# Patient Record
Sex: Male | Born: 1975 | Race: White | Hispanic: No | Smoking: Former smoker
Health system: Southern US, Community
[De-identification: ages and names within clinical notes are randomized; demographics above are authoritative.]

## PROBLEM LIST (undated history)

## (undated) DIAGNOSIS — I10 Essential (primary) hypertension: Secondary | ICD-10-CM

---

## 2018-11-15 ENCOUNTER — Other Ambulatory Visit: Payer: Self-pay

## 2018-11-15 ENCOUNTER — Emergency Department (HOSPITAL_COMMUNITY)
Admission: EM | Admit: 2018-11-15 | Discharge: 2018-11-15 | Disposition: A | Discharge status: Home / Self Care | Payer: Self-pay | Attending: Emergency Medicine | Admitting: Emergency Medicine

## 2018-11-15 ENCOUNTER — Encounter (HOSPITAL_COMMUNITY): Payer: Self-pay

## 2018-11-15 DIAGNOSIS — Z87891 Personal history of nicotine dependence: Secondary | ICD-10-CM | POA: Insufficient documentation

## 2018-11-15 DIAGNOSIS — I10 Essential (primary) hypertension: Secondary | ICD-10-CM | POA: Insufficient documentation

## 2018-11-15 HISTORY — DX: Essential (primary) hypertension: I10

## 2018-11-15 LAB — CBC
HCT: 45.2 % (ref 39.0–52.0)
Hemoglobin: 14.6 g/dL (ref 13.0–17.0)
MCH: 28.2 pg (ref 26.0–34.0)
MCHC: 32.3 g/dL (ref 30.0–36.0)
MCV: 87.4 fL (ref 80.0–100.0)
NRBC: 0 % (ref 0.0–0.2)
Platelets: 203 10*3/uL (ref 150–400)
RBC: 5.17 MIL/uL (ref 4.22–5.81)
RDW: 13.4 % (ref 11.5–15.5)
WBC: 8.5 10*3/uL (ref 4.0–10.5)

## 2018-11-15 LAB — BASIC METABOLIC PANEL
ANION GAP: 6 (ref 5–15)
BUN: 15 mg/dL (ref 6–20)
CALCIUM: 8.9 mg/dL (ref 8.9–10.3)
CO2: 26 mmol/L (ref 22–32)
Chloride: 104 mmol/L (ref 98–111)
Creatinine, Ser: 0.8 mg/dL (ref 0.61–1.24)
GFR calc Af Amer: >60 mL/min (ref >60)
GFR calc non Af Amer: >60 mL/min (ref >60)
GLUCOSE: 108 mg/dL — AB (ref 70–99)
POTASSIUM: 4 mmol/L (ref 3.5–5.1)
Sodium: 136 mmol/L (ref 135–145)

## 2018-11-15 MED ORDER — LISINOPRIL 10 MG PO TABS
10.0000 mg | ORAL_TABLET | Freq: Every day | ORAL | Status: DC
  Administered 2018-11-15: 10 mg via ORAL
  Filled 2018-11-15: qty 1

## 2018-11-15 MED ORDER — LISINOPRIL 10 MG PO TABS: 10.0000 mg | ORAL_TABLET | Freq: Every day | ORAL | 3 refills | Status: DC

## 2018-11-15 NOTE — ED Notes (Signed)
EDP at bedside  

## 2018-11-15 NOTE — ED Triage Notes (Signed)
Pt's wife reports pt was having physical for a new job today and was told he had uncontrolled htn.  Reports bp was 148/102.  Reports pt used to be on lisinopril but hasn't taken it in 3 years.  Pt doesn't have a pcp at this time and needs bp controlled before he can get hired.

## 2018-11-15 NOTE — Discharge Instructions (Addendum)
Complete blood count and kidney function normal today.  Initiate the lisinopril 10 mg each day for treatment of your high blood pressure.  First dose given here today.  Keep a daily log of your blood pressures.  Follow-up in 1 week to have pressures reevaluated either at the free clinic in [place] or by the urgent care [place] in [place].  Return for any stroke symptoms severe headache severe chest pain or trouble breathing.  As we discussed, for your job this will be the initiation of your blood pressure therapy;  it will probably require adjustments to fine-tune it.  You are stable and safe to start work.

## 2018-11-15 NOTE — ED Provider Notes (Signed)
[facility] EMERGENCY DEPARTMENT Provider Note   CSN: [id number] Arrival date & time: 11/15/18  1527     History   Chief Complaint Chief Complaint  Patient presents with  . Hypertension    HPI Andrew Fields is a 42 y.o. male.  Patient being evaluated for new job.  Had his physical.  Was noted to have elevated blood pressure with systolics in the 150s.  Patient used to be on lisinopril 10 mg daily been off for about 3 years.  Patient without any specific symptoms.  No severe headache no severe chest pain no trouble breathing no strokelike symptoms.  Job referred him in for evaluation of the high blood pressure and to initiate therapy.  Patient currently does not have a primary care provider.     Past Medical History:  Diagnosis Date  . Hypertension     There are no active problems to display for this patient.   History reviewed. No pertinent surgical history.      Home Medications    Prior to Admission medications   Not on File    Family History No family history on file.  Social History Social History   Tobacco Use  . Smoking status: Former Games developer  . Smokeless tobacco: Never Used  Substance Use Topics  . Alcohol use: Yes    Comment: occ  . Drug use: Never     Allergies   Patient has no known allergies.   Review of Systems Review of Systems  Constitutional: Negative for fever.  HENT: Negative for congestion.   Eyes: Negative for visual disturbance.  Respiratory: Negative for shortness of breath.   Cardiovascular: Negative for chest pain, palpitations and leg swelling.  Gastrointestinal: Negative for abdominal pain, nausea and vomiting.  Genitourinary: Negative for dysuria and hematuria.  Musculoskeletal: Negative for neck pain.  Skin: Negative for rash.  Neurological: Negative for seizures, syncope and headaches.  Hematological: Does not bruise/bleed easily.  Psychiatric/Behavioral: Negative for confusion.     Physical  Exam Updated Vital Signs BP (!) 139/99   Pulse 83   Temp 97.6 F (36.4 C) (Oral)   Resp 14   Ht 1.956 m (6\' 5" )   Wt 129.3 kg   SpO2 100%   BMI 33.80 kg/m   Physical Exam  Constitutional: He is oriented to person, place, and time. He appears well-developed and well-nourished. No distress.  HENT:  Head: Normocephalic and atraumatic.  Mouth/Throat: Oropharynx is clear and moist.  Eyes: Pupils are equal, round, and reactive to light. Conjunctivae and EOM are normal.  Neck: Neck supple.  Cardiovascular: Normal rate, regular rhythm and normal heart sounds.  Pulmonary/Chest: Effort normal and breath sounds normal. No respiratory distress.  Abdominal: Soft. Bowel sounds are normal. There is no tenderness.  Musculoskeletal: Normal range of motion. He exhibits no edema.  Neurological: He is alert and oriented to person, place, and time. No cranial nerve deficit or sensory deficit. He exhibits normal muscle tone. Coordination normal.  Skin: Skin is warm.  Nursing note and vitals reviewed.    ED Treatments / Results  Labs (all labs ordered are listed, but only abnormal results are displayed) Labs Reviewed  BASIC METABOLIC PANEL - Abnormal; Notable for the following components:      Result Value   Glucose, Bld 108 (*)    All other components within normal limits  CBC    EKG EKG Interpretation  Date/Time:  Tuesday November 15 2018 15:44:29 EST Ventricular Rate:  86 PR Interval:  QRS Duration: 98 QT Interval:  376 QTC Calculation: 450 R Axis:   72 Text Interpretation:  Sinus rhythm Probable inferior infarct, age indeterminate Baseline wander in lead(s) III No previous ECGs available Confirmed by Vanetta MuldersZackowski, Carylon Tamburro [phone]([id number] on 11/15/2018 4:13:58 PM   Radiology No results found.  Procedures Procedures (including critical care time)  Medications Ordered in ED Medications  lisinopril (PRINIVIL,ZESTRIL) tablet 10 mg (has no administration in time range)     Initial  Impression / Assessment and Plan / ED Course  I have reviewed the triage vital signs and the nursing notes.  Pertinent labs & imaging results that were available during my care of the patient were reviewed by me and considered in my medical decision making (see chart for details).   Patient with a long-standing history of hypertension.  Has been off medication which she is to be lisinopril probably for about 3 years.  During a job physical today blood pressure was noted to have systolic pressures around 150.  Most recently just in the emergency department here at time of discharge without any treatment patient's blood pressure was 138/93.  However the general trend here today is been elevated.  Since he was on blood pressure medicine in the past most likely needs a restart.  Patient's CBC and basic metabolic panel here without any abnormalities.  EKG without any acute findings.  Patient will be restarted on lisinopril.  He has ability to check his blood pressure for a log at home.  He will follow-up either with urgent care or the free clinic here in [place].  Patient is stable for return back to work.  Patient without any significant endorgan abnormalities no severe headache no severe chest pain no shortness of breath no strokelike symptoms.    Final Clinical Impressions(s) / ED Diagnoses   Final diagnoses:  Essential hypertension    ED Discharge Orders    None       Vanetta MuldersZackowski, Camreigh Michie, MD 11/15/18 1722

## 2018-12-05 ENCOUNTER — Emergency Department (HOSPITAL_COMMUNITY)
Admission: EM | Admit: 2018-12-05 | Discharge: 2018-12-05 | Disposition: A | Discharge status: Home / Self Care | Payer: Self-pay | Attending: Emergency Medicine | Admitting: Emergency Medicine

## 2018-12-05 ENCOUNTER — Other Ambulatory Visit: Payer: Self-pay

## 2018-12-05 ENCOUNTER — Encounter (HOSPITAL_COMMUNITY): Payer: Self-pay | Admitting: Emergency Medicine

## 2018-12-05 DIAGNOSIS — L02214 Cutaneous abscess of groin: Secondary | ICD-10-CM

## 2018-12-05 DIAGNOSIS — Z23 Encounter for immunization: Secondary | ICD-10-CM | POA: Insufficient documentation

## 2018-12-05 DIAGNOSIS — I10 Essential (primary) hypertension: Secondary | ICD-10-CM | POA: Insufficient documentation

## 2018-12-05 DIAGNOSIS — Z87891 Personal history of nicotine dependence: Secondary | ICD-10-CM | POA: Insufficient documentation

## 2018-12-05 MED ORDER — DOXYCYCLINE HYCLATE 100 MG PO CAPS: 100.0000 mg | ORAL_CAPSULE | Freq: Two times a day (BID) | ORAL | 0 refills | Status: AC

## 2018-12-05 MED ORDER — LIDOCAINE HCL (PF) 1 % IJ SOLN
INTRAMUSCULAR | Status: AC
  Administered 2018-12-05: 10 mL
  Filled 2018-12-05: qty 20

## 2018-12-05 MED ORDER — TETANUS-DIPHTH-ACELL PERTUSSIS 5-2.5-18.5 LF-MCG/0.5 IM SUSP
0.5000 mL | Freq: Once | INTRAMUSCULAR | Status: AC
  Administered 2018-12-05: 0.5 mL via INTRAMUSCULAR
  Filled 2018-12-05: qty 0.5

## 2018-12-05 MED ORDER — DOXYCYCLINE HYCLATE 100 MG PO TABS
100.0000 mg | ORAL_TABLET | Freq: Once | ORAL | Status: AC
  Administered 2018-12-05: 100 mg via ORAL
  Filled 2018-12-05: qty 1

## 2018-12-05 MED ORDER — LIDOCAINE HCL (PF) 1 % IJ SOLN
10.0000 mL | Freq: Once | INTRAMUSCULAR | Status: AC
  Administered 2018-12-05: 10 mL

## 2018-12-05 NOTE — ED Triage Notes (Signed)
Patient reports knot in hir R groin that started Thursday. Fever and chills with body aches since Thursday as well.

## 2018-12-05 NOTE — ED Provider Notes (Signed)
Emergency Department Provider Note   I have reviewed the triage vital signs and the nursing notes.   HISTORY  Chief Complaint Groin Pain   HPI Andrew Fields is a 42 y.o. male with PMH of HTN presents to the emergency department for evaluation of right groin redness and swelling with associated fever and chills at home.  Symptoms have been ongoing for the past 5 days.  Symptoms worse at night.  Patient has developed a gradually enlarging right groin area of redness.  He tried to squeeze and pop the area but only produced blood.  Patient also admits to me that he did take a razor blade and make a small nick in the skin to try and get fluid out but was not successful.  He has never had an abscess in the past.  He denies any testicle or scrotal pain/swelling, or redness.  No abdominal or back discomfort. No dysuria, hesitancy, or urgency.   Past Medical History:  Diagnosis Date  . Hypertension     There are no active problems to display for this patient.   History reviewed. No pertinent surgical history.  Allergies Patient has no known allergies.  Family History  Problem Relation Age of Onset  . Hypertension Mother   . Hypertension Father     Social History Social History   Tobacco Use  . Smoking status: Former Games developermoker  . Smokeless tobacco: Never Used  Substance Use Topics  . Alcohol use: Yes    Comment: occ  . Drug use: Never    Review of Systems  Constitutional: Positive fever/chills Eyes: No visual changes. ENT: No sore throat. Cardiovascular: Denies chest pain. Respiratory: Denies shortness of breath. Gastrointestinal: No abdominal pain.  No nausea, no vomiting.  No diarrhea.  No constipation. Genitourinary: Negative for dysuria. Musculoskeletal: Negative for back pain. Skin: Right groin erythema and warmth.  Neurological: Negative for headaches, focal weakness or numbness.  10-point ROS otherwise  negative.  ____________________________________________   PHYSICAL EXAM:  VITAL SIGNS: ED Triage Vitals  Enc Vitals Group     BP 12/05/18 1821 139/78     Pulse Rate 12/05/18 1821 97     Resp 12/05/18 1821 20     Temp 12/05/18 1821 99.5 F (37.5 C)     Temp src --      SpO2 12/05/18 1821 100 %     Weight 12/05/18 1822 282 lb (127.9 kg)     Height 12/05/18 1822 6\' 5"  (1.956 m)     Pain Score 12/05/18 1821 0   Constitutional: Alert and oriented. Well appearing and in no acute distress. Eyes: Conjunctivae are normal. Head: Atraumatic. Nose: No congestion/rhinnorhea. Mouth/Throat: Mucous membranes are moist.  Neck: No stridor. Cardiovascular: Normal rate, regular rhythm. Good peripheral circulation. Grossly normal heart sounds.   Respiratory: Normal respiratory effort.  No retractions. Lungs CTAB. Gastrointestinal: Soft and nontender. No distention.  Musculoskeletal: No lower extremity tenderness nor edema. No gross deformities of extremities. Neurologic:  Normal speech and language. No gross focal neurologic deficits are appreciated.  Skin:  Skin is warm and dry. Right inguinal area of induration measuring 3 cm x 3 cm with mild central fluctuance. 5 cm area of surrounding cellulitis. No crepitus. No tracking to the scrotum or perineum.   ____________________________________________  RADIOLOGY  None ____________________________________________   PROCEDURES  Procedure(s) performed:   Marland Kitchen.Marland Kitchen.Incision and Drainage Date/Time: 12/05/2018 7:33 PM Performed by: Maia PlanLong, Joshua G, MD Authorized by: Maia PlanLong, Joshua G, MD   Consent:  Consent obtained:  Verbal   Consent given by:  Patient   Risks discussed:  Bleeding, damage to other organs, infection, incomplete drainage and pain Location:    Type:  Abscess   Size:  3   Location:  Lower extremity   Lower extremity location:  Leg   Leg location:  R upper leg Pre-procedure details:    Skin preparation:  Betadine Anesthesia (see  MAR for exact dosages):    Anesthesia method:  Local infiltration   Local anesthetic:  Lidocaine 1% w/o epi Procedure type:    Complexity:  Simple Procedure details:    Needle aspiration: no     Incision types:  Single straight   Incision depth:  Subcutaneous   Scalpel blade:  11   Wound management:  Probed and deloculated   Drainage:  Bloody and purulent   Drainage amount:  Scant   Wound treatment:  Wound left open   Packing materials:  None Post-procedure details:    Patient tolerance of procedure:  Tolerated well, no immediate complications    EMERGENCY DEPARTMENT US SOFT TISSUE INTERPRETATION "Study: Limited Soft Tissue Ultrasound"  INDICATIONS: Pain and Soft tissue infection Multiple views of the body part were obtained in real-time with a multi-frequency linear probe PERFORMED BY:  Myself SIDE:Right  BODY PART:Lower extremity Right inguinal area.  FINDINGS: Abcess present and Cellulitis present INTERPRETATION:  Abcess present and Cellulitis present   CPT:  Lower extremity [phone]  ____________________________________________   INITIAL IMPRESSION / ASSESSMENT AND PLAN / ED COURSE  Pertinent labs & imaging results that were available during my care of the patient were reviewed by me and considered in my medical decision making (see chart for details).  Patient presents to the emergency department with fever and right groin swelling.  Exam is most consistent with a right inguinal abscess which is incompletely drained by the patient at home.  I do plan to update the patient's tetanus given the home attempt at incision and drainage.  No evidence on exam to suspect hernia.  Performed bedside ultrasound which showed small area of fluid collection in the subcutaneous tissues with some surrounding cellulitis.   I&D performed at bedside as outlined above. No packing with small pocket. Starting Doxycycline with first dose given in the ED. No concern for sepsis or gangrene.  Discussed wound care at home, PCP follow up plan, and ED return precautions.   ____________________________________________  FINAL CLINICAL IMPRESSION(S) / ED DIAGNOSES  Final diagnoses:  Soft tissue abscess of inguinal region     MEDICATIONS GIVEN DURING THIS VISIT:  Medications  lidocaine (PF) (XYLOCAINE) 1 % injection 10 mL (10 mLs Infiltration Given by Other 12/05/18 1907)  Tdap (BOOSTRIX) injection 0.5 mL (0.5 mLs Intramuscular Given 12/05/18 1936)  doxycycline (VIBRA-TABS) tablet 100 mg (100 mg Oral Given 12/05/18 1935)     NEW OUTPATIENT MEDICATIONS STARTED DURING THIS VISIT:  Discharge Medication List as of 12/05/2018  7:30 PM    START taking these medications   Details  doxycycline (VIBRAMYCIN) 100 MG capsule Take 1 capsule (100 mg total) by mouth 2 (two) times daily for 7 days., Starting Mon 12/05/2018, Until Mon 12/12/2018, Print        Note:  This document was prepared using Dragon voice recognition software and may include unintentional dictation errors.  Alona Bene, MD Emergency Medicine    Long, Arlyss Repress, MD 12/06/18 [phone]

## 2018-12-05 NOTE — Discharge Instructions (Signed)
You have been seen in the Emergency Department (ED) today for an abscess.  This was drained in the ED.  Please follow up with your doctor or in the ED in 24-48 hours for recheck of your wound.  Read through the additional discharge instructions included below regarding wound care recommendations.  Keep the wound clean and dry, though you may wash as you would normally.  Change the dressing twice daily.  Call your doctor sooner or return to the ED if you develop worsening signs of infection such as: increased redness, increased pain, pus, or fever.      

## 2019-06-05 DIAGNOSIS — S83281D Other tear of lateral meniscus, current injury, right knee, subsequent encounter: Secondary | ICD-10-CM | POA: Diagnosis not present

## 2019-06-05 DIAGNOSIS — M1711 Unilateral primary osteoarthritis, right knee: Secondary | ICD-10-CM | POA: Diagnosis not present

## 2019-06-28 HISTORY — PX: KNEE ARTHROSCOPY W/ MENISCAL REPAIR: SHX1877

## 2019-07-07 DIAGNOSIS — M1711 Unilateral primary osteoarthritis, right knee: Secondary | ICD-10-CM | POA: Diagnosis not present

## 2019-07-07 DIAGNOSIS — X58XXXA Exposure to other specified factors, initial encounter: Secondary | ICD-10-CM | POA: Diagnosis not present

## 2019-07-07 DIAGNOSIS — S83241A Other tear of medial meniscus, current injury, right knee, initial encounter: Secondary | ICD-10-CM | POA: Diagnosis not present

## 2019-07-07 DIAGNOSIS — M23321 Other meniscus derangements, posterior horn of medial meniscus, right knee: Secondary | ICD-10-CM | POA: Diagnosis not present

## 2019-07-07 DIAGNOSIS — M25561 Pain in right knee: Secondary | ICD-10-CM | POA: Diagnosis not present

## 2019-07-13 DIAGNOSIS — M1711 Unilateral primary osteoarthritis, right knee: Secondary | ICD-10-CM | POA: Diagnosis not present

## 2019-07-21 DIAGNOSIS — L02415 Cutaneous abscess of right lower limb: Secondary | ICD-10-CM | POA: Diagnosis not present

## 2019-07-21 DIAGNOSIS — Z79899 Other long term (current) drug therapy: Secondary | ICD-10-CM | POA: Diagnosis not present

## 2019-07-21 DIAGNOSIS — L03314 Cellulitis of groin: Secondary | ICD-10-CM | POA: Diagnosis not present

## 2019-07-21 DIAGNOSIS — F17290 Nicotine dependence, other tobacco product, uncomplicated: Secondary | ICD-10-CM | POA: Diagnosis not present

## 2019-07-21 DIAGNOSIS — I10 Essential (primary) hypertension: Secondary | ICD-10-CM | POA: Diagnosis not present

## 2019-07-21 DIAGNOSIS — L03818 Cellulitis of other sites: Secondary | ICD-10-CM | POA: Diagnosis not present

## 2019-08-01 DIAGNOSIS — M1711 Unilateral primary osteoarthritis, right knee: Secondary | ICD-10-CM | POA: Diagnosis not present

## 2019-08-10 ENCOUNTER — Emergency Department (HOSPITAL_COMMUNITY)
Admission: EM | Admit: 2019-08-10 | Discharge: 2019-08-10 | Disposition: A | Discharge status: Home / Self Care | Payer: BC Managed Care – PPO | Attending: Emergency Medicine | Admitting: Emergency Medicine

## 2019-08-10 ENCOUNTER — Other Ambulatory Visit: Payer: Self-pay

## 2019-08-10 ENCOUNTER — Encounter (HOSPITAL_COMMUNITY): Payer: Self-pay | Admitting: Emergency Medicine

## 2019-08-10 ENCOUNTER — Emergency Department (HOSPITAL_COMMUNITY): Payer: BC Managed Care – PPO

## 2019-08-10 DIAGNOSIS — I10 Essential (primary) hypertension: Secondary | ICD-10-CM | POA: Diagnosis not present

## 2019-08-10 DIAGNOSIS — R079 Chest pain, unspecified: Secondary | ICD-10-CM | POA: Insufficient documentation

## 2019-08-10 DIAGNOSIS — Z87891 Personal history of nicotine dependence: Secondary | ICD-10-CM | POA: Diagnosis not present

## 2019-08-10 DIAGNOSIS — Z79899 Other long term (current) drug therapy: Secondary | ICD-10-CM | POA: Insufficient documentation

## 2019-08-10 LAB — CBC WITH DIFFERENTIAL/PLATELET
Abs Immature Granulocytes: 0.01 10*3/uL (ref 0.00–0.07)
Basophils Absolute: 0 10*3/uL (ref 0.0–0.1)
Basophils Relative: 1 %
Eosinophils Absolute: 0.1 10*3/uL (ref 0.0–0.5)
Eosinophils Relative: 2 %
HCT: 43.4 % (ref 39.0–52.0)
Hemoglobin: 14.1 g/dL (ref 13.0–17.0)
Immature Granulocytes: 0 %
Lymphocytes Relative: 33 %
Lymphs Abs: 1.8 10*3/uL (ref 0.7–4.0)
MCH: 28.4 pg (ref 26.0–34.0)
MCHC: 32.5 g/dL (ref 30.0–36.0)
MCV: 87.3 fL (ref 80.0–100.0)
Monocytes Absolute: 0.5 10*3/uL (ref 0.1–1.0)
Monocytes Relative: 10 %
Neutro Abs: 2.9 10*3/uL (ref 1.7–7.7)
Neutrophils Relative %: 54 %
Platelets: 217 10*3/uL (ref 150–400)
RBC: 4.97 MIL/uL (ref 4.22–5.81)
RDW: 13.8 % (ref 11.5–15.5)
WBC: 5.4 10*3/uL (ref 4.0–10.5)
nRBC: 0 % (ref 0.0–0.2)

## 2019-08-10 LAB — COMPREHENSIVE METABOLIC PANEL
ALT: 28 U/L (ref 0–44)
AST: 24 U/L (ref 15–41)
Albumin: 3.7 g/dL (ref 3.5–5.0)
Alkaline Phosphatase: 65 U/L (ref 38–126)
Anion gap: 7 (ref 5–15)
BUN: 14 mg/dL (ref 6–20)
CO2: 28 mmol/L (ref 22–32)
Calcium: 9.1 mg/dL (ref 8.9–10.3)
Chloride: 106 mmol/L (ref 98–111)
Creatinine, Ser: 0.9 mg/dL (ref 0.61–1.24)
GFR calc Af Amer: >60 mL/min (ref >60)
GFR calc non Af Amer: >60 mL/min (ref >60)
Glucose, Bld: 105 mg/dL — ABNORMAL HIGH (ref 70–99)
Potassium: 3.8 mmol/L (ref 3.5–5.1)
Sodium: 141 mmol/L (ref 135–145)
Total Bilirubin: 0.4 mg/dL (ref 0.3–1.2)
Total Protein: 7.2 g/dL (ref 6.5–8.1)

## 2019-08-10 LAB — TROPONIN I (HIGH SENSITIVITY)
Troponin I (High Sensitivity): 2 ng/L (ref <18)
Troponin I (High Sensitivity): <2 ng/L (ref <18)

## 2019-08-10 LAB — LIPASE, BLOOD: Lipase: 36 U/L (ref 11–51)

## 2019-08-10 NOTE — ED Provider Notes (Addendum)
[facility] Provider Note   CSN: [id number] Arrival date & time: 08/10/19  0630    History   Chief Complaint Chief Complaint  Patient presents with  . Chest Pain    HPI Toris Laverdiere is a 43 y.o. male.  HPI: A 43 year old patient with a history of hypertension and obesity presents for evaluation of chest pain. Initial onset of pain was more than 6 hours ago. The patient's chest pain is not worse with exertion. The patient's chest pain is middle- or left-sided, is not well-localized, is not described as heaviness/pressure/tightness, is not sharp and does radiate to the arms/jaw/neck. The patient does not complain of nausea and denies diaphoresis. The patient has no history of stroke, has no history of peripheral artery disease, has not smoked in the past 90 days, denies any history of treated diabetes, has no relevant family history of coronary artery disease (first degree relative at less than age 30) and has no history of hypercholesterolemia.   Patient presents to the emergency department for evaluation of chest pain.  Patient has been experiencing left lower chest discomfort for 4 or 5 days.  Symptoms have been intermittent.  He has not identified anything that actually causes the pain.  He has noticed pain with exertion at times, however he has also done some strenuous activities over the last few days it did not cause pain.  He does feel like resting helps the pain when it is present.  He has felt mild shortness of breath with the pain.  Tonight he started to feel some tingling in the left arm.  Patient does not have any known coronary artery disease.  He does have history of hypertension.  No family history of heart disease.     Past Medical History:  Diagnosis Date  . Hypertension     There are no active problems to display for this patient.   History reviewed. No pertinent surgical history.      Home Medications    Prior to Admission  medications   Medication Sig Start Date End Date Taking? Authorizing Provider  lisinopril (PRINIVIL,ZESTRIL) 10 MG tablet Take 1 tablet (10 mg total) by mouth daily. 11/15/18   Fredia Sorrow, MD    Family History Family History  Problem Relation Age of Onset  . Hypertension Mother   . Hypertension Father     Social History Social History   Tobacco Use  . Smoking status: Former Research scientist (life sciences)  . Smokeless tobacco: Never Used  Substance Use Topics  . Alcohol use: Yes    Comment: occ  . Drug use: Never     Allergies   Patient has no known allergies.   Review of Systems Review of Systems  Respiratory: Positive for shortness of breath.   Cardiovascular: Positive for chest pain.  All other systems reviewed and are negative.    Physical Exam Updated Vital Signs BP (!) 143/102 (BP Location: Right Arm)   Pulse 88   Temp 98.7 F (37.1 C) (Oral)   Resp 16   SpO2 100%   Physical Exam Vitals signs and nursing note reviewed.  Constitutional:      General: He is not in acute distress.    Appearance: Normal appearance. He is well-developed.  HENT:     Head: Normocephalic and atraumatic.     Right Ear: Hearing normal.     Left Ear: Hearing normal.     Nose: Nose normal.  Eyes:     Conjunctiva/sclera: Conjunctivae normal.  Pupils: Pupils are equal, round, and reactive to light.  Neck:     Musculoskeletal: Normal range of motion and neck supple.  Cardiovascular:     Rate and Rhythm: Regular rhythm.     Heart sounds: S1 normal and S2 normal. No murmur. No friction rub. No gallop.   Pulmonary:     Effort: Pulmonary effort is normal. No respiratory distress.     Breath sounds: Normal breath sounds.  Chest:     Chest wall: No tenderness.  Abdominal:     General: Bowel sounds are normal.     Palpations: Abdomen is soft.     Tenderness: There is no abdominal tenderness. There is no guarding or rebound. Negative signs include Murphy's sign and McBurney's sign.     Hernia:  No hernia is present.  Musculoskeletal: Normal range of motion.  Skin:    General: Skin is warm and dry.     Findings: No rash.  Neurological:     Mental Status: He is alert and oriented to person, place, and time.     GCS: GCS eye subscore is 4. GCS verbal subscore is 5. GCS motor subscore is 6.     Cranial Nerves: No cranial nerve deficit.     Sensory: No sensory deficit.     Coordination: Coordination normal.  Psychiatric:        Speech: Speech normal.        Behavior: Behavior normal.        Thought Content: Thought content normal.      ED Treatments / Results  Labs (all labs ordered are listed, but only abnormal results are displayed) Labs Reviewed  CBC WITH DIFFERENTIAL/PLATELET  COMPREHENSIVE METABOLIC PANEL  LIPASE, BLOOD  TROPONIN I (HIGH SENSITIVITY)    EKG EKG Interpretation  Date/Time:  Thursday August 10 2019 06:41:48 EDT Ventricular Rate:  91 PR Interval:    QRS Duration: 93 QT Interval:  366 QTC Calculation: 451 R Axis:   78 Text Interpretation:  Sinus rhythm Normal ECG Confirmed by Gilda CreasePollina, [place] J [phone] on 08/10/2019 6:52:59 AM   Radiology No results found.  Procedures Procedures (including critical care time)  Medications Ordered in ED Medications - No data to display   Initial Impression / Assessment and Plan / ED Course  I have reviewed the triage vital signs and the nursing notes.  Pertinent labs & imaging results that were available during my care of the patient were reviewed by me and considered in my medical decision making (see chart for details).  Clinical Course as of Aug 10 700  Thu Aug 10, 2019  0710 Received sign out from night doc - chest pain, ekg okay, vitals stable, awaiting labs   [RD]  [phone] Recheck patient, reviewed lab results, reviewed return precautions and PCP follow-up recommendation   [RD]  0926 Chest pain-free currently   [RD]    Clinical Course User Index [RD] Milagros Lollykstra, Richard S, MD    HEAR  Score: 2  Patient presents with chest pain.  His cardiac risk factors are increased BMI and hypertension.  Pain is not typical.  He does not have clearly exertional chest pain, although he has had some pain with exertion at times.  The pain can also occur at rest.  He feels mild shortness of breath.  None of the symptoms are currently present.  He felt moderate to severe pain earlier this morning which prompted him to come to the ER.  EKG is normal at arrival.  Patient's heart  score is 2.  Will follow heart pathway and perform cardiac evaluation.  Patient felt to be low risk.  Will sign out to oncoming ER physician.  Final Clinical Impressions(s) / ED Diagnoses   Final diagnoses:  Chest pain, unspecified type    ED Discharge Orders    None       Gilda CreasePollina, Shepherd Finnan J, MD 08/10/19 [phone]    Gilda CreasePollina, Locklan Canoy J, MD 08/11/19 [phone]

## 2019-08-10 NOTE — ED Provider Notes (Signed)
  [facility] EMERGENCY DEPARTMENT Provider Note  Received Signout from Dr. Betsey Holiday at 6:00AM. Chest pain, work up pending.  ED Treatments / Results  Labs (all labs ordered are listed, but only abnormal results are displayed) Labs Reviewed  COMPREHENSIVE METABOLIC PANEL - Abnormal; Notable for the following components:      Result Value   Glucose, Bld 105 (*)    All other components within normal limits  CBC WITH DIFFERENTIAL/PLATELET  LIPASE, BLOOD  TROPONIN I (HIGH SENSITIVITY)  TROPONIN I (HIGH SENSITIVITY)    EKG EKG Interpretation  Date/Time:  Thursday August 10 2019 06:41:48 EDT Ventricular Rate:  91 PR Interval:    QRS Duration: 93 QT Interval:  366 QTC Calculation: 451 R Axis:   78 Text Interpretation:  Sinus rhythm Normal ECG Confirmed by Orpah Greek [phone] on 08/10/2019 6:52:59 AM   Radiology Dg Chest 2 View  Result Date: 08/10/2019 CLINICAL DATA:  Chest pain EXAM: CHEST - 2 VIEW COMPARISON:  None. FINDINGS: Lungs are clear. Heart size and pulmonary vascularity are normal. No adenopathy. No pneumothorax. No bone lesions. IMPRESSION: No edema or consolidation. Electronically Signed   By: Lowella Grip III M.D.   On: 08/10/2019 07:08     Initial Impression / Assessment and Plan / ED Course  I have reviewed the triage vital signs and the nursing notes.  Pertinent labs & imaging results that were available during my care of the patient were reviewed by me and considered in my medical decision making (see chart for details).  Clinical Course as of Aug 09 925  Thu Aug 10, 2019  0710 Received sign out from night doc - chest pain, ekg okay, vitals stable, awaiting labs   [RD]  [phone] Recheck patient, reviewed lab results, reviewed return precautions and PCP follow-up recommendation   [RD]  0926 Chest pain-free currently   [RD]    Clinical Course User Index [RD] Lucrezia Starch, MD    HEAR Score: 51  43 year old male presented to the ER with  chest pain.  Received in signout at 7 AM from Dr. Levora Angel.  Please refer to his note for full history and physical exam.  Disposition and plan pending labs.  Labs unremarkable, troponin within normal limits, EKG normal.  Chest x-ray without acute process.  Reassessed patient chest pain-free.  Given above work-up, believe appropriate for discharge and outpatient management.  Suspect most likely etiology MSK nature.  Discussed return precautions and PCP follow-up.  Final Clinical Impressions(s) / ED Diagnoses   Final diagnoses:  Chest pain, unspecified type    ED Discharge Orders    None       Lucrezia Starch, MD 08/10/19 [phone]

## 2019-08-10 NOTE — ED Triage Notes (Signed)
Pt c/o left sided chest pain that radiates to arm pit and arms 4-5 days.

## 2019-08-10 NOTE — Discharge Instructions (Signed)
Please return to the emergency room if you develop worsening chest pain, difficulty breathing or other new concerning symptom.  Recommend follow-up with your primary doctor for recheck later this week.

## 2019-08-23 DIAGNOSIS — M1711 Unilateral primary osteoarthritis, right knee: Secondary | ICD-10-CM | POA: Diagnosis not present

## 2019-08-23 DIAGNOSIS — Y999 Unspecified external cause status: Secondary | ICD-10-CM | POA: Diagnosis not present

## 2019-08-23 DIAGNOSIS — S83231A Complex tear of medial meniscus, current injury, right knee, initial encounter: Secondary | ICD-10-CM | POA: Diagnosis not present

## 2019-08-23 DIAGNOSIS — S83241A Other tear of medial meniscus, current injury, right knee, initial encounter: Secondary | ICD-10-CM | POA: Diagnosis not present

## 2019-08-23 DIAGNOSIS — M94261 Chondromalacia, right knee: Secondary | ICD-10-CM | POA: Diagnosis not present

## 2019-08-23 DIAGNOSIS — X58XXXA Exposure to other specified factors, initial encounter: Secondary | ICD-10-CM | POA: Diagnosis not present

## 2019-09-01 DIAGNOSIS — S83281D Other tear of lateral meniscus, current injury, right knee, subsequent encounter: Secondary | ICD-10-CM | POA: Diagnosis not present

## 2019-10-06 DIAGNOSIS — M1711 Unilateral primary osteoarthritis, right knee: Secondary | ICD-10-CM | POA: Diagnosis not present

## 2019-10-06 DIAGNOSIS — S83281A Other tear of lateral meniscus, current injury, right knee, initial encounter: Secondary | ICD-10-CM | POA: Diagnosis not present

## 2019-10-09 DIAGNOSIS — I10 Essential (primary) hypertension: Secondary | ICD-10-CM | POA: Diagnosis not present

## 2019-10-09 DIAGNOSIS — R079 Chest pain, unspecified: Secondary | ICD-10-CM | POA: Diagnosis not present

## 2019-10-09 DIAGNOSIS — Z79899 Other long term (current) drug therapy: Secondary | ICD-10-CM | POA: Diagnosis not present

## 2019-10-09 DIAGNOSIS — M940 Chondrocostal junction syndrome [Tietze]: Secondary | ICD-10-CM | POA: Diagnosis not present

## 2019-10-12 ENCOUNTER — Encounter: Payer: Self-pay | Admitting: *Deleted

## 2019-10-12 ENCOUNTER — Telehealth: Payer: Self-pay | Admitting: Cardiology

## 2019-10-12 ENCOUNTER — Other Ambulatory Visit: Payer: Self-pay

## 2019-10-12 ENCOUNTER — Ambulatory Visit (INDEPENDENT_AMBULATORY_CARE_PROVIDER_SITE_OTHER): Payer: BC Managed Care – PPO | Admitting: Cardiology

## 2019-10-12 ENCOUNTER — Encounter: Payer: Self-pay | Admitting: Cardiology

## 2019-10-12 VITALS — BP 123/79 | HR 91 | Ht 77.0 in | Wt 298.0 lb

## 2019-10-12 DIAGNOSIS — R0789 Other chest pain: Secondary | ICD-10-CM

## 2019-10-12 NOTE — Telephone Encounter (Signed)
Pre-cert Verification for the following procedure    GXT scheduled for 10-17-2019 at [facility]

## 2019-10-12 NOTE — Progress Notes (Signed)
Clinical Summary Mr. Vangorder is a 43 y.o.male seen today as a new patient for chest pain.   1. Chest pain - seen at [facility] 09/2019 with chest pain - CXR no acute process, trop neg, EKG SR no ischemic chagnes - discharged from ER with prednisone.   - symptoms started a few months ago    - reports in his early 33s started having anxiety attacks - seen by cardiology around that time in [place], [place]. Reports had stress test then that was benign - since that time has had intermittent symptoms  - symptoms recently have been different.  - pressure midchest to left side. Can occur at rest or with activity, mostly in the evening. Can occur with sitting watching tv or walking around. 8/10 in severity. Mild nausea. Can last 30 minutes up to a couple of hours. Sometimes can be mildly tender to palpation. Increase in frequency and severity. Occurs daily  - sedentary lifestyle due to recent knee injury. Can have some chest pain with activities, fatigue - priolosec not better - prednisone did not help. - can have some chest pressures after coffee or beer.    CAD risk factors: lisinopril, former tobacco x 15 years, mom MI age 11 (he reports she had severe diabetes as well)  Past Medical History:  Diagnosis Date  . Hypertension      No Known Allergies   Current Outpatient Medications  Medication Sig Dispense Refill  . lisinopril (PRINIVIL,ZESTRIL) 10 MG tablet Take 1 tablet (10 mg total) by mouth daily. 30 tablet 3   No current facility-administered medications for this visit.      No past surgical history on file.   No Known Allergies    Family History  Problem Relation Age of Onset  . Hypertension Mother   . Hypertension Father      Social History Mr. Shehadeh reports that he has quit smoking. He has never used smokeless tobacco. Mr. Fleek reports current alcohol use.   Review of Systems CONSTITUTIONAL: No weight loss, fever, chills, weakness or fatigue.   HEENT: Eyes: No visual loss, blurred vision, double vision or yellow sclerae.No hearing loss, sneezing, congestion, runny nose or sore throat.  SKIN: No rash or itching.  CARDIOVASCULAR: per hpi RESPIRATORY: No shortness of breath, cough or sputum.  GASTROINTESTINAL: No anorexia, nausea, vomiting or diarrhea. No abdominal pain or blood.  GENITOURINARY: No burning on urination, no polyuria NEUROLOGICAL: No headache, dizziness, syncope, paralysis, ataxia, numbness or tingling in the extremities. No change in bowel or bladder control.  MUSCULOSKELETAL: No muscle, back pain, joint pain or stiffness.  LYMPHATICS: No enlarged nodes. No history of splenectomy.  PSYCHIATRIC: No history of depression or anxiety.  ENDOCRINOLOGIC: No reports of sweating, cold or heat intolerance. No polyuria or polydipsia.  Marland Kitchen   Physical Examination Today's Vitals   10/12/19 1438  BP: 123/79  Pulse: 91  SpO2: 97%  Weight: 298 lb (135.2 kg)  Height: 6\' 5"  (1.956 m)   Body mass index is 35.34 kg/m.  Gen: resting comfortably, no acute distress HEENT: no scleral icterus, pupils equal round and reactive, no palptable cervical adenopathy,  CV: RRR, no m/r/g, no jvd Resp: Clear to auscultation bilaterally GI: abdomen is soft, non-tender, non-distended, normal bowel sounds, no hepatosplenomegaly MSK: extremities are warm, no edema.  Skin: warm, no rash Neuro:  no focal deficits Psych: appropriate affect     Assessment and Plan  1. Chest pain - mixed somewhat atypical symptoms, has not  improved with recent emperic trials of antacid and prednisone - he does have risk factors including his mother with MI in her early 1s - will plan for a GXT to further evaluate    F/u pending stress test results. If negative GXT would consider GI referal, I don't believe he has a pcp so we would need to make      Antoine Poche, M.D.

## 2019-10-12 NOTE — Patient Instructions (Addendum)
Your physician recommends that you schedule a follow-up appointment PENDING TEST RESULTS WITH DR [facility]  Your physician recommends that you continue on your current medications as directed. Please refer to the Current Medication list given to you today.  Your physician has requested that you have an exercise tolerance test. For further information please visit [email or web address]. Please also follow instruction sheet, as given.  Thank you for choosing [facility]!!

## 2019-10-13 ENCOUNTER — Other Ambulatory Visit (HOSPITAL_COMMUNITY)
Admission: RE | Admit: 2019-10-13 | Discharge: 2019-10-13 | Disposition: A | Discharge status: Home / Self Care | Payer: BC Managed Care – PPO | Source: Ambulatory Visit | Attending: Cardiology | Admitting: Cardiology

## 2019-10-13 DIAGNOSIS — Z20828 Contact with and (suspected) exposure to other viral communicable diseases: Secondary | ICD-10-CM | POA: Insufficient documentation

## 2019-10-13 LAB — SARS CORONAVIRUS 2 (TAT 6-24 HRS): SARS Coronavirus 2: NEGATIVE

## 2019-10-16 ENCOUNTER — Telehealth: Payer: Self-pay | Admitting: *Deleted

## 2019-10-16 NOTE — Telephone Encounter (Signed)
-----   Message from Arnoldo Lenis, MD sent at 10/16/2019 12:05 PM EDT ----- COVID test is negative   [name] MD

## 2019-10-16 NOTE — Telephone Encounter (Signed)
LM to return call.

## 2019-10-16 NOTE — Telephone Encounter (Signed)
Pt aware - routed to pcp  

## 2019-10-17 ENCOUNTER — Other Ambulatory Visit: Payer: Self-pay

## 2019-10-17 ENCOUNTER — Encounter (HOSPITAL_COMMUNITY): Payer: BC Managed Care – PPO

## 2019-10-17 ENCOUNTER — Ambulatory Visit (HOSPITAL_COMMUNITY)
Admission: RE | Admit: 2019-10-17 | Discharge: 2019-10-17 | Disposition: A | Discharge status: Home / Self Care | Payer: BC Managed Care – PPO | Source: Ambulatory Visit | Attending: Cardiology | Admitting: Cardiology

## 2019-10-17 ENCOUNTER — Ambulatory Visit (HOSPITAL_COMMUNITY): Payer: BC Managed Care – PPO

## 2019-10-17 DIAGNOSIS — R0789 Other chest pain: Secondary | ICD-10-CM | POA: Insufficient documentation

## 2019-10-17 LAB — EXERCISE TOLERANCE TEST
Estimated workload: 9.2 METS
Exercise duration (min): 6 min
Exercise duration (sec): 43 s
MPHR: 176 {beats}/min
Peak HR: 164 {beats}/min
Percent HR: 93 %
RPE: 13
Rest HR: 82 {beats}/min

## 2019-10-18 ENCOUNTER — Telehealth: Payer: Self-pay | Admitting: Cardiology

## 2019-10-18 DIAGNOSIS — R1013 Epigastric pain: Secondary | ICD-10-CM

## 2019-10-18 NOTE — Telephone Encounter (Signed)
-----   Message from Arnoldo Lenis, MD sent at 10/18/2019 10:34 AM EDT ----- Normal stress test, no evidence of any blockages. We need to look at other possible causes. Can we refer to GI for epigastric pain. F/u with me 3 months   [name] MD

## 2019-10-18 NOTE — Telephone Encounter (Signed)
Patient informed and verbalized understanding of plan. 

## 2019-10-18 NOTE — Telephone Encounter (Signed)
Patient asking for test results

## 2019-10-24 ENCOUNTER — Encounter: Payer: Self-pay | Admitting: Internal Medicine

## 2019-11-05 IMAGING — DX CHEST - 2 VIEW
2 series · 2 of 2 positions shown · non-contrast
Comparison: None.

CLINICAL DATA: Chest pain

EXAM:
CHEST - 2 VIEW

[chest pa]
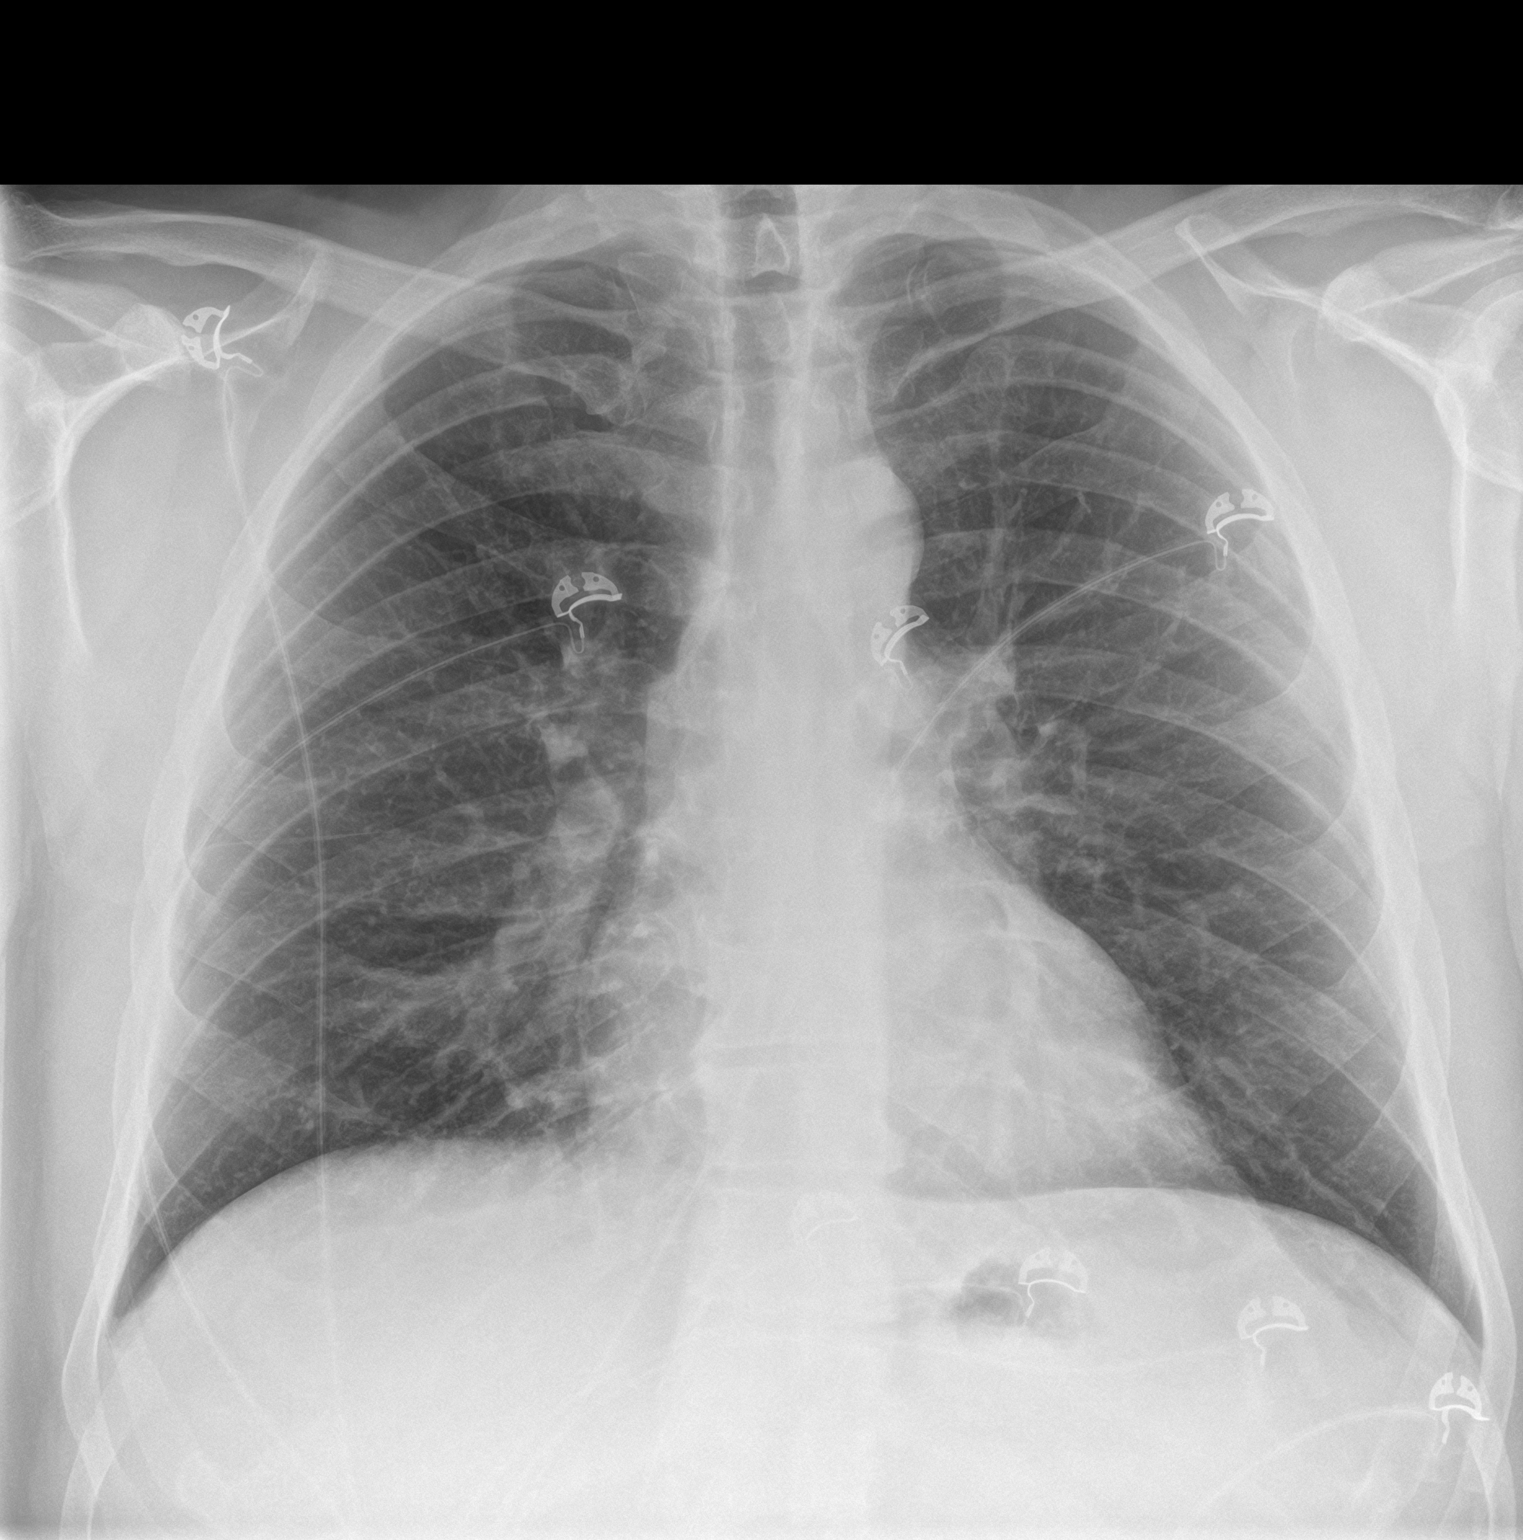

[chest lat]
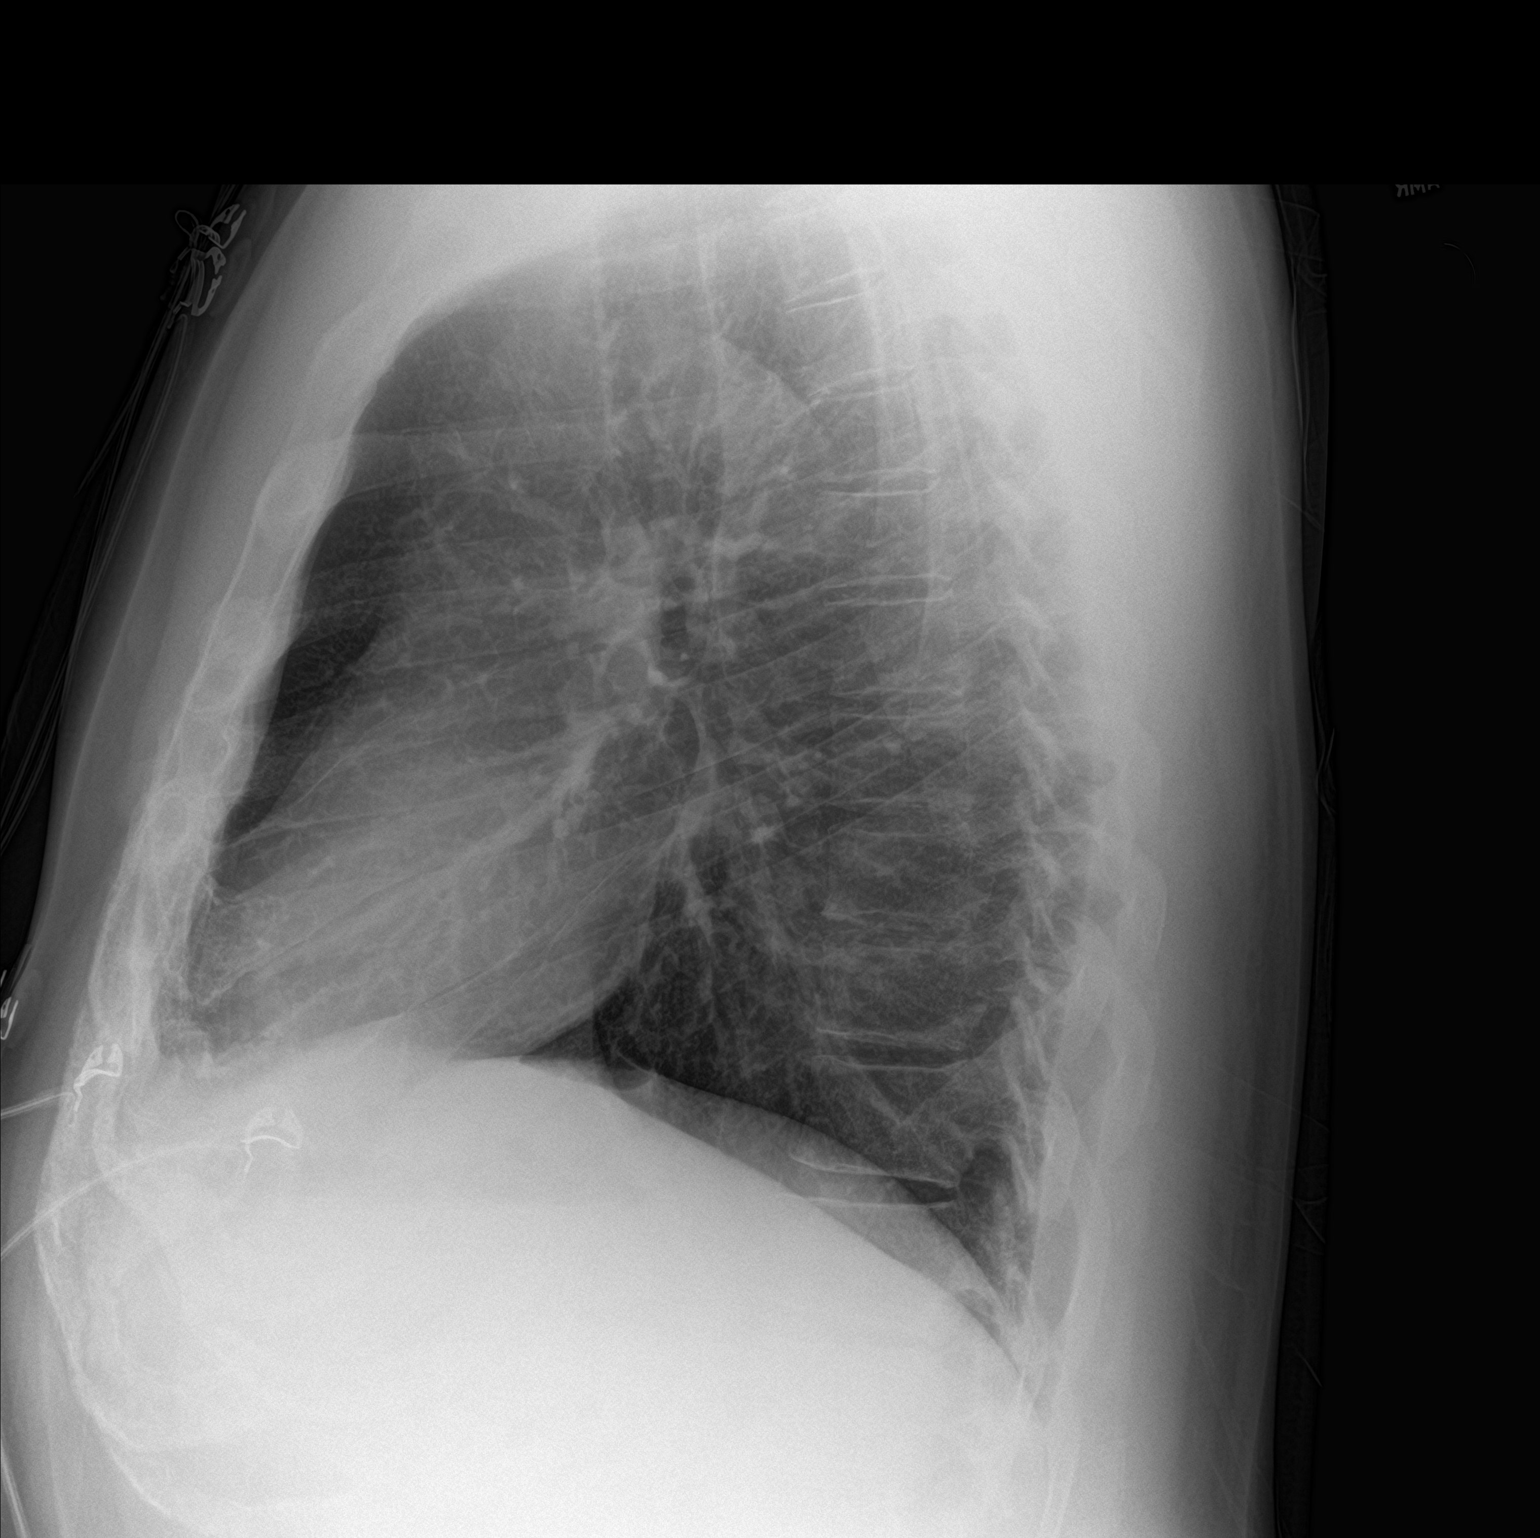

[2 of 2 positions shown; findings below may reference images not displayed]

FINDINGS: Lungs are clear. Heart size and pulmonary vascularity are normal. No
adenopathy. No pneumothorax. No bone lesions.
IMPRESSION: No edema or consolidation.

## 2019-11-08 ENCOUNTER — Ambulatory Visit: Payer: BC Managed Care – PPO | Admitting: Gastroenterology

## 2019-11-08 ENCOUNTER — Encounter: Payer: Self-pay | Admitting: Internal Medicine

## 2019-11-08 ENCOUNTER — Telehealth: Payer: Self-pay | Admitting: Internal Medicine

## 2019-11-08 NOTE — Telephone Encounter (Signed)
PATIENT WAS A NO SHOW AND LETTER SENT  °

## 2019-11-10 DIAGNOSIS — M1711 Unilateral primary osteoarthritis, right knee: Secondary | ICD-10-CM | POA: Diagnosis not present

## 2019-11-10 DIAGNOSIS — M25461 Effusion, right knee: Secondary | ICD-10-CM | POA: Diagnosis not present

## 2020-01-22 ENCOUNTER — Telehealth: Payer: BC Managed Care – PPO | Admitting: Cardiology

## 2020-03-04 ENCOUNTER — Emergency Department (HOSPITAL_COMMUNITY)
Admission: EM | Admit: 2020-03-04 | Discharge: 2020-03-04 | Disposition: A | Discharge status: Home / Self Care | Payer: Self-pay | Attending: Emergency Medicine | Admitting: Emergency Medicine

## 2020-03-04 ENCOUNTER — Other Ambulatory Visit: Payer: Self-pay

## 2020-03-04 ENCOUNTER — Emergency Department (HOSPITAL_COMMUNITY): Payer: Self-pay

## 2020-03-04 ENCOUNTER — Encounter (HOSPITAL_COMMUNITY): Payer: Self-pay | Admitting: Emergency Medicine

## 2020-03-04 DIAGNOSIS — I10 Essential (primary) hypertension: Secondary | ICD-10-CM | POA: Insufficient documentation

## 2020-03-04 DIAGNOSIS — Z7982 Long term (current) use of aspirin: Secondary | ICD-10-CM | POA: Insufficient documentation

## 2020-03-04 DIAGNOSIS — R0602 Shortness of breath: Secondary | ICD-10-CM | POA: Insufficient documentation

## 2020-03-04 DIAGNOSIS — Z79899 Other long term (current) drug therapy: Secondary | ICD-10-CM | POA: Insufficient documentation

## 2020-03-04 DIAGNOSIS — Z87891 Personal history of nicotine dependence: Secondary | ICD-10-CM | POA: Insufficient documentation

## 2020-03-04 DIAGNOSIS — R0789 Other chest pain: Secondary | ICD-10-CM | POA: Insufficient documentation

## 2020-03-04 LAB — COMPREHENSIVE METABOLIC PANEL
ALT: 58 U/L — ABNORMAL HIGH (ref 0–44)
AST: 74 U/L — ABNORMAL HIGH (ref 15–41)
Albumin: 4.1 g/dL (ref 3.5–5.0)
Alkaline Phosphatase: 73 U/L (ref 38–126)
Anion gap: 8 (ref 5–15)
BUN: 25 mg/dL — ABNORMAL HIGH (ref 6–20)
CO2: 28 mmol/L (ref 22–32)
Calcium: 9.4 mg/dL (ref 8.9–10.3)
Chloride: 106 mmol/L (ref 98–111)
Creatinine, Ser: 1.08 mg/dL (ref 0.61–1.24)
GFR calc Af Amer: >60 mL/min (ref >60)
GFR calc non Af Amer: >60 mL/min (ref >60)
Glucose, Bld: 109 mg/dL — ABNORMAL HIGH (ref 70–99)
Potassium: 4.5 mmol/L (ref 3.5–5.1)
Sodium: 142 mmol/L (ref 135–145)
Total Bilirubin: 0.7 mg/dL (ref 0.3–1.2)
Total Protein: 7.5 g/dL (ref 6.5–8.1)

## 2020-03-04 LAB — CBC WITH DIFFERENTIAL/PLATELET
Abs Immature Granulocytes: 0.02 10*3/uL (ref 0.00–0.07)
Basophils Absolute: 0 10*3/uL (ref 0.0–0.1)
Basophils Relative: 0 %
Eosinophils Absolute: 0.2 10*3/uL (ref 0.0–0.5)
Eosinophils Relative: 2 %
HCT: 45.1 % (ref 39.0–52.0)
Hemoglobin: 14.8 g/dL (ref 13.0–17.0)
Immature Granulocytes: 0 %
Lymphocytes Relative: 33 %
Lymphs Abs: 2.4 10*3/uL (ref 0.7–4.0)
MCH: 29.4 pg (ref 26.0–34.0)
MCHC: 32.8 g/dL (ref 30.0–36.0)
MCV: 89.5 fL (ref 80.0–100.0)
Monocytes Absolute: 0.7 10*3/uL (ref 0.1–1.0)
Monocytes Relative: 9 %
Neutro Abs: 3.9 10*3/uL (ref 1.7–7.7)
Neutrophils Relative %: 56 %
Platelets: 219 10*3/uL (ref 150–400)
RBC: 5.04 MIL/uL (ref 4.22–5.81)
RDW: 13.8 % (ref 11.5–15.5)
WBC: 7.1 10*3/uL (ref 4.0–10.5)
nRBC: 0 % (ref 0.0–0.2)

## 2020-03-04 LAB — MAGNESIUM: Magnesium: 2.1 mg/dL (ref 1.7–2.4)

## 2020-03-04 LAB — PROTIME-INR
INR: 1 (ref 0.8–1.2)
Prothrombin Time: 13.2 seconds (ref 11.4–15.2)

## 2020-03-04 LAB — D-DIMER, QUANTITATIVE: D-Dimer, Quant: 0.28 ug/mL-FEU (ref 0.00–0.50)

## 2020-03-04 LAB — TROPONIN I (HIGH SENSITIVITY): Troponin I (High Sensitivity): 2 ng/L (ref <18)

## 2020-03-04 LAB — BRAIN NATRIURETIC PEPTIDE: B Natriuretic Peptide: 14 pg/mL (ref 0.0–100.0)

## 2020-03-04 MED ORDER — NAPROXEN 500 MG PO TABS: 500.0000 mg | ORAL_TABLET | Freq: Two times a day (BID) | ORAL | 0 refills | Status: AC

## 2020-03-04 MED ORDER — ASPIRIN 81 MG PO CHEW
324.0000 mg | CHEWABLE_TABLET | Freq: Once | ORAL | Status: AC
  Administered 2020-03-04: 324 mg via ORAL
  Filled 2020-03-04: qty 4

## 2020-03-04 NOTE — ED Triage Notes (Signed)
Pt c/o of central chest pain x 3 months.

## 2020-03-04 NOTE — Discharge Instructions (Signed)
As discussed, your evaluation today has been largely reassuring.  But, it is important that you monitor your condition carefully, and do not hesitate to return to the ED if you develop new, or concerning changes in your condition. ? ?Otherwise, please follow-up with your physician for appropriate ongoing care. ? ?

## 2020-03-04 NOTE — ED Provider Notes (Signed)
[facility] EMERGENCY DEPARTMENT Provider Note   CSN: [id number] Arrival date & time: 03/04/20  1405     History Chief Complaint  Patient presents with  . Chest Pain    Andrew Fields is a 44 y.o. male.  HPI    Patient presents concern of chest pain. He notes that he has had this pain on and off for several months.  He has been seen and evaluated by cardiology, had a stress test, that was reportedly unremarkable.  He does take medication for hypertension, and GERD; the latter was formally increased. He notes that over the past 2 months he has had episodes of chest tightness, from the left parasternal area to the left inframammary area.  Symptoms are possibly worse with laying down, but not changed with exertion, nor inspiration. Today he was speaking with a telemedicine clinician and after discussing his chest pain he was sent here for evaluation. Past Medical History:  Diagnosis Date  . Hypertension     There are no problems to display for this patient.   History reviewed. No pertinent surgical history.     Family History  Problem Relation Age of Onset  . Hypertension Mother   . Hypertension Father     Social History   Tobacco Use  . Smoking status: Former Games developer  . Smokeless tobacco: Never Used  Substance Use Topics  . Alcohol use: Yes    Comment: occ  . Drug use: Never    Home Medications Prior to Admission medications   Medication Sig Start Date End Date Taking? Authorizing Provider  aspirin EC 81 MG tablet Take 81 mg by mouth daily.   Yes [provider]  lisinopril (PRINIVIL,ZESTRIL) 10 MG tablet Take 1 tablet (10 mg total) by mouth daily. 11/15/18  Yes Vanetta Mulders, MD  Multiple Vitamin (MULTIVITAMIN) tablet Take 1 tablet by mouth daily.   Yes [provider]  Omega-3 Fatty Acids (FISH OIL) 1000 MG CAPS Take 1 capsule by mouth every morning.   Yes [provider]  omeprazole (PRILOSEC) 40 MG capsule Take 40 mg by  mouth daily.   Yes [provider]    Allergies    Patient has no known allergies.  Review of Systems   Review of Systems  Constitutional:       Per HPI, otherwise negative  HENT:       Per HPI, otherwise negative  Respiratory:       Per HPI, otherwise negative  Cardiovascular:       Per HPI, otherwise negative  Gastrointestinal: Negative for vomiting.  Endocrine:       Negative aside from HPI  Genitourinary:       Neg aside from HPI   Musculoskeletal:       Per HPI, otherwise negative  Skin: Negative.   Neurological: Negative for syncope.    Physical Exam Updated Vital Signs BP 127/82 (BP Location: Right Arm)   Pulse (!) 102   Temp 98 F (36.7 C) (Oral)   Resp 18   Ht 6\' 5"  (1.956 m)   Wt 133.8 kg   SpO2 98%   BMI 34.98 kg/m   Physical Exam Vitals and nursing note reviewed.  Constitutional:      General: He is not in acute distress.    Appearance: He is well-developed.  HENT:     Head: Normocephalic and atraumatic.  Eyes:     Conjunctiva/sclera: Conjunctivae normal.  Cardiovascular:     Rate and Rhythm: Normal rate  and regular rhythm.  Pulmonary:     Effort: Pulmonary effort is normal. No respiratory distress.     Breath sounds: No stridor.  Abdominal:     General: There is no distension.  Skin:    General: Skin is warm and dry.  Neurological:     Mental Status: He is alert and oriented to person, place, and time.     ED Results / Procedures / Treatments   Labs (all labs ordered are listed, but only abnormal results are displayed) Labs Reviewed  COMPREHENSIVE METABOLIC PANEL - Abnormal; Notable for the following components:      Result Value   Glucose, Bld 109 (*)    BUN 25 (*)    AST 74 (*)    ALT 58 (*)    All other components within normal limits  MAGNESIUM  BRAIN NATRIURETIC PEPTIDE  CBC WITH DIFFERENTIAL/PLATELET  PROTIME-INR  D-DIMER, QUANTITATIVE (NOT AT [facility])  CBG MONITORING, ED  TROPONIN I (HIGH SENSITIVITY)     EKG EKG Interpretation  Date/Time:  Monday March 04 2020 14:20:08 EST Ventricular Rate:  91 PR Interval:  150 QRS Duration: 88 QT Interval:  348 QTC Calculation: 428 R Axis:   -43 Text Interpretation: Normal sinus rhythm with sinus arrhythmia Left axis deviation No significant change since last tracing Abnormal ECG Confirmed by Carmin Muskrat [phone]) on 03/04/2020 2:23:33 PM   Radiology DG Chest Portable 1 View  Result Date: 03/04/2020 CLINICAL DATA:  Chest pain. EXAM: PORTABLE CHEST 1 VIEW COMPARISON:  August 10, 2019. FINDINGS: The heart size and mediastinal contours are within normal limits. Both lungs are clear. No pneumothorax or pleural effusion is noted. The visualized skeletal structures are unremarkable. IMPRESSION: No active disease. Electronically Signed   By: Marijo Conception M.D.   On: 03/04/2020 14:55    Procedures Procedures (including critical care time)  Medications Ordered in ED Medications  aspirin chewable tablet 324 mg (324 mg Oral Given 03/04/20 1436)    ED Course  I have reviewed the triage vital signs and the nursing notes.  Pertinent labs & imaging results that were available during my care of the patient were reviewed by me and considered in my medical decision making (see chart for details).     EMR review: Normal stress test, no evidence of any blockages. We need to look at other possible causes. Can we refer to GI for epigastric pain. F/u with me 3 months     [name] MD  MDM Rules/Calculators/A&P                      4:51 PM Patient is awake, alert, sitting upright.  He remains hemodynamically unremarkable, no increased work of breathing, no appreciable tachycardia, heart rate in the 90s, with no notable changes on monitor.  We reviewed today's findings including reassuring labs, x-ray, normal D-dimer, normal troponin, reassuring for absence of PE, dissection, and ongoing coronary ischemia respectively. Patient's hear score is 1, and given  the duration of symptoms for months, low suspicion acute new findings, suspicion for inflammatory condition.  Patient amenable to starting anti-inflammatory medication, following up with cardiology. Final Clinical Impression(s) / ED Diagnoses Final diagnoses:  Atypical chest pain    Rx / DC Orders ED Discharge Orders         Ordered    naproxen (NAPROSYN) 500 MG tablet  2 times daily     03/04/20 [place],  Molly Maduro, MD 03/04/20 1654

## 2020-04-17 ENCOUNTER — Emergency Department (HOSPITAL_COMMUNITY): Payer: Self-pay

## 2020-04-17 ENCOUNTER — Encounter (HOSPITAL_COMMUNITY): Payer: Self-pay | Admitting: Emergency Medicine

## 2020-04-17 ENCOUNTER — Emergency Department (HOSPITAL_COMMUNITY)
Admission: EM | Admit: 2020-04-17 | Discharge: 2020-04-17 | Disposition: A | Discharge status: Home / Self Care | Payer: Self-pay | Attending: Emergency Medicine | Admitting: Emergency Medicine

## 2020-04-17 ENCOUNTER — Other Ambulatory Visit: Payer: Self-pay

## 2020-04-17 DIAGNOSIS — Z87891 Personal history of nicotine dependence: Secondary | ICD-10-CM | POA: Insufficient documentation

## 2020-04-17 DIAGNOSIS — R0789 Other chest pain: Secondary | ICD-10-CM | POA: Insufficient documentation

## 2020-04-17 DIAGNOSIS — Z79899 Other long term (current) drug therapy: Secondary | ICD-10-CM | POA: Insufficient documentation

## 2020-04-17 DIAGNOSIS — I10 Essential (primary) hypertension: Secondary | ICD-10-CM | POA: Insufficient documentation

## 2020-04-17 DIAGNOSIS — Z7982 Long term (current) use of aspirin: Secondary | ICD-10-CM | POA: Insufficient documentation

## 2020-04-17 DIAGNOSIS — R1012 Left upper quadrant pain: Secondary | ICD-10-CM | POA: Insufficient documentation

## 2020-04-17 LAB — TROPONIN I (HIGH SENSITIVITY)
Troponin I (High Sensitivity): 5 ng/L (ref <18)
Troponin I (High Sensitivity): 6 ng/L (ref <18)

## 2020-04-17 LAB — CBC
HCT: 44.3 % (ref 39.0–52.0)
Hemoglobin: 14.8 g/dL (ref 13.0–17.0)
MCH: 29.5 pg (ref 26.0–34.0)
MCHC: 33.4 g/dL (ref 30.0–36.0)
MCV: 88.2 fL (ref 80.0–100.0)
Platelets: 197 10*3/uL (ref 150–400)
RBC: 5.02 MIL/uL (ref 4.22–5.81)
RDW: 13.6 % (ref 11.5–15.5)
WBC: 5.5 10*3/uL (ref 4.0–10.5)
nRBC: 0 % (ref 0.0–0.2)

## 2020-04-17 LAB — HEPATIC FUNCTION PANEL
ALT: 47 U/L — ABNORMAL HIGH (ref 0–44)
AST: 55 U/L — ABNORMAL HIGH (ref 15–41)
Albumin: 3.9 g/dL (ref 3.5–5.0)
Alkaline Phosphatase: 79 U/L (ref 38–126)
Bilirubin, Direct: <0.1 mg/dL (ref 0.0–0.2)
Total Bilirubin: 0.7 mg/dL (ref 0.3–1.2)
Total Protein: 7.4 g/dL (ref 6.5–8.1)

## 2020-04-17 LAB — BASIC METABOLIC PANEL
Anion gap: 9 (ref 5–15)
BUN: 15 mg/dL (ref 6–20)
CO2: 27 mmol/L (ref 22–32)
Calcium: 9.4 mg/dL (ref 8.9–10.3)
Chloride: 105 mmol/L (ref 98–111)
Creatinine, Ser: 0.92 mg/dL (ref 0.61–1.24)
GFR calc Af Amer: >60 mL/min (ref >60)
GFR calc non Af Amer: >60 mL/min (ref >60)
Glucose, Bld: 118 mg/dL — ABNORMAL HIGH (ref 70–99)
Potassium: 3.9 mmol/L (ref 3.5–5.1)
Sodium: 141 mmol/L (ref 135–145)

## 2020-04-17 LAB — LIPASE, BLOOD: Lipase: 23 U/L (ref 11–51)

## 2020-04-17 MED ORDER — IOHEXOL 300 MG/ML  SOLN
100.0000 mL | Freq: Once | INTRAMUSCULAR | Status: AC | PRN
  Administered 2020-04-17: 12:00:00 100 mL via INTRAVENOUS

## 2020-04-17 MED ORDER — METHOCARBAMOL 500 MG PO TABS: 500.0000 mg | ORAL_TABLET | Freq: Three times a day (TID) | ORAL | 0 refills | Status: DC

## 2020-04-17 MED ORDER — KETOROLAC TROMETHAMINE 30 MG/ML IJ SOLN
30.0000 mg | Freq: Once | INTRAMUSCULAR | Status: AC
  Administered 2020-04-17: 11:00:00 30 mg via INTRAVENOUS
  Filled 2020-04-17: qty 1

## 2020-04-17 NOTE — ED Triage Notes (Signed)
Pt reports left sided mid axillary/rib cage pain that radiates to chest and left side of abdomen intermittently for last several months. Pt rpeorts most recent episode started last night.pt reports nausea. Denies any known injury, v/d. Pt reports seen for same in the past and reports has had xray on rib cage. Pt denies any known fracture.

## 2020-04-17 NOTE — Progress Notes (Signed)
CSW has reviewed patients chart and notes that patient is without primary care and/or insurance. CSW will follow up with patient regarding this matter and provide assistance as needed.   Andrew Fields M. Andrew Fields LCSWA Transitions of Care  Clinical Social Worker  Ph: [phone] 

## 2020-04-17 NOTE — Discharge Instructions (Signed)
The CTs of your chest and abdomen today were negative for acute findings.  CT of your chest did show several small lung nodules that are likely benign.  These will need to be rechecked in 6 months to 1 year.  These are not likely the source of your pain.  Continue taking your Prilosec as directed.  You may need to follow-up with your gastroenterologist for recheck.  You may also follow-up with your primary doctor.  I have listed local primary physicians in the area for you to establish primary care if needed.  Return emergency department for any worsening symptoms.

## 2020-04-17 NOTE — ED Provider Notes (Signed)
[facility] EMERGENCY DEPARTMENT Provider Note   CSN: [id number] Arrival date & time: 04/17/20  8101     History Chief Complaint  Patient presents with  . Chest Pain    Andrew Fields is a 44 y.o. male.  HPI      Andrew Fields is a 44 y.o. male past medical significant for hypertension.  He presents to the Emergency Department complaining of waxing and waning left lateral chest wall pain.  Symptoms have been present for 1 year.  He describes a dull pain along his left chest wall from the left axilla to the base of the ribs.  Pain also radiates to left upper back at times and he endorses epigastric pain, but not currently.  No known precipitating or alleviating factors.  He states he has been seen in the past by cardiology and gastroenterology.  He states that he had a normal stress test no evidence of cardiac blockages.  He was then seen by GI and placed on a proton pump inhibitor which has not improved his symptoms.  He was seen in the emergency department in March with similar symptoms without clear source of symptoms.  He denies known injury, fever, chills, shortness of breath, nausea or vomiting or cough. No recent illness.    Past Medical History:  Diagnosis Date  . Hypertension     There are no problems to display for this patient.   History reviewed. No pertinent surgical history.     Family History  Problem Relation Age of Onset  . Hypertension Mother   . Hypertension Father     Social History   Tobacco Use  . Smoking status: Former Games developer  . Smokeless tobacco: Never Used  Substance Use Topics  . Alcohol use: Yes    Comment: occ  . Drug use: Never    Home Medications Prior to Admission medications   Medication Sig Start Date End Date Taking? Authorizing Provider  aspirin EC 81 MG tablet Take 81 mg by mouth daily.   Yes [provider]  lisinopril (PRINIVIL,ZESTRIL) 10 MG tablet Take 1 tablet (10 mg total) by mouth daily. 11/15/18  Yes  Vanetta Mulders, MD  omeprazole (PRILOSEC) 40 MG capsule Take 40 mg by mouth daily.   Yes [provider]    Allergies    Patient has no known allergies.  Review of Systems   Review of Systems  Constitutional: Negative for appetite change, chills and fever.  HENT: Negative for congestion, sore throat and trouble swallowing.   Respiratory: Negative for cough, chest tightness, shortness of breath and wheezing.   Cardiovascular: Positive for chest pain (left lateral chest pain).  Gastrointestinal: Positive for abdominal pain. Negative for diarrhea, nausea and vomiting.  Genitourinary: Negative for dysuria and flank pain.  Musculoskeletal: Negative for arthralgias.  Skin: Negative for rash.  Neurological: Negative for dizziness, weakness and numbness.  Hematological: Negative for adenopathy.    Physical Exam Updated Vital Signs BP 127/80   Pulse 75   Temp 98.6 F (37 C) (Oral)   Resp 18   Ht 6\' 5"  (1.956 m)   Wt 129.3 kg   SpO2 100%   BMI 33.80 kg/m   Physical Exam Vitals and nursing note reviewed.  Constitutional:      Appearance: Normal appearance. He is well-developed. He is not ill-appearing.  HENT:     Head: Normocephalic.     Mouth/Throat:     Mouth: Mucous membranes are moist.  Eyes:     Pupils: Pupils  are equal, round, and reactive to light.  Neck:     Thyroid: No thyromegaly.     Meningeal: Kernig's sign absent.  Cardiovascular:     Rate and Rhythm: Normal rate and regular rhythm.  Pulmonary:     Effort: Pulmonary effort is normal.     Breath sounds: Normal breath sounds. No wheezing.  Chest:     Chest wall: No tenderness.  Abdominal:     General: There is no distension.     Palpations: Abdomen is soft.     Tenderness: There is no abdominal tenderness. There is no right CVA tenderness, left CVA tenderness, guarding or rebound.  Musculoskeletal:        General: Normal range of motion.     Cervical back: Normal range of motion and neck supple.    Skin:    General: Skin is warm.     Findings: No rash.  Neurological:     Mental Status: He is alert and oriented to person, place, and time.     Sensory: No sensory deficit.     Motor: No weakness.     ED Results / Procedures / Treatments   Labs (all labs ordered are listed, but only abnormal results are displayed) Labs Reviewed  BASIC METABOLIC PANEL - Abnormal; Notable for the following components:      Result Value   Glucose, Bld 118 (*)    All other components within normal limits  HEPATIC FUNCTION PANEL - Abnormal; Notable for the following components:   AST 55 (*)    ALT 47 (*)    All other components within normal limits  CBC  LIPASE, BLOOD  TROPONIN I (HIGH SENSITIVITY)  TROPONIN I (HIGH SENSITIVITY)    EKG EKG Interpretation  Date/Time:  Wednesday April 17 2020 09:28:25 EDT Ventricular Rate:  68 PR Interval:  172 QRS Duration: 94 QT Interval:  380 QTC Calculation: 404 R Axis:   57 Text Interpretation: Normal sinus rhythm with sinus arrhythmia Normal ECG Confirmed by Vanetta Mulders [phone]) on 04/17/2020 11:00:54 AM   Radiology DG Chest 2 View  Result Date: 04/17/2020 CLINICAL DATA:  Left-sided mid axillary rib cage pain. EXAM: CHEST - 2 VIEW COMPARISON:  03/04/2020 FINDINGS: The heart size and mediastinal contours are within normal limits. Both lungs are clear. The visualized skeletal structures are unremarkable. IMPRESSION: No active cardiopulmonary disease. Electronically Signed   By: Kennith Center M.D.   On: 04/17/2020 10:01   CT Chest W Contrast  Result Date: 04/17/2020 CLINICAL DATA:  Left upper quadrant abdominal pain radiating to the chest and abdomen for last several months. Last episode last night. EXAM: CT CHEST, ABDOMEN, AND PELVIS WITH CONTRAST TECHNIQUE: Multidetector CT imaging of the chest, abdomen and pelvis was performed following the standard protocol during bolus administration of intravenous contrast. CONTRAST:  OMNIPAQUE IOHEXOL  300 MG/ML  SOLN COMPARISON:  None FINDINGS: CT CHEST FINDINGS Cardiovascular: Thoracic aorta is normal caliber. No periaortic stranding. Normal appearance of the heart no pericardial effusion. Central pulmonary vasculature is unremarkable. Mediastinum/Nodes: Thoracic inlet structures are normal. No adenopathy in the mediastinum or bilateral hila. No axillary lymphadenopathy. Esophagus is normal by CT. Lungs/Pleura: No consolidation. No pleural effusion. Small nodules in the chest. 2-3 mm nodules in the right lung base (image 112, series 4) (Image 109, series 4) 4 mm right lower lobe pulmonary nodule. Left upper lobe nodule at periphery approximately 3 mm. Musculoskeletal: No chest wall lesion. See below for full musculoskeletal details. CT ABDOMEN PELVIS FINDINGS  Hepatobiliary: Liver is normal. Gallbladder is unremarkable. No biliary ductal dilation. Pancreas: Pancreas is normal without focal lesion, ductal dilation or inflammation. Spleen: The spleen is unremarkable with small adjacent splenule. Tiny low-density lesion in the upper portion of the spleen, statistically benign measuring approximately 8 mm. Adrenals/Urinary Tract: Adrenal glands are normal. Kidneys are smooth in terms of contour without suspicious focal renal lesion. No ureteral calculus or other abnormality. Stomach/Bowel: Gastrointestinal tract is unremarkable. The appendix is normal. Vascular/Lymphatic: No atherosclerosis. No adenopathy. No aneurysmal dilation. No pelvic adenopathy. Reproductive: Prostate is normal. Other: No abdominal wall hernia or abnormality. No abdominopelvic ascites. Musculoskeletal: Spinal degenerative changes. No acute or destructive bone process. IMPRESSION: 1. Tiny pulmonary nodules largest 4 mm as described. No follow-up needed if patient is low-risk (and has no known or suspected primary neoplasm). Non-contrast chest CT can be considered in 12 months if patient is high-risk. This recommendation follows the consensus  statement: Guidelines for Management of Incidental Pulmonary Nodules Detected on CT Images: From the Fleischner Society 2017; Radiology 2017; 284:228-243. 2. No acute findings in the chest, abdomen or pelvis. Electronically Signed   By: Zetta Bills M.D.   On: 04/17/2020 12:26   CT ABDOMEN PELVIS W CONTRAST  Result Date: 04/17/2020 CLINICAL DATA:  Left upper quadrant abdominal pain radiating to the chest and abdomen for last several months. Last episode last night. EXAM: CT CHEST, ABDOMEN, AND PELVIS WITH CONTRAST TECHNIQUE: Multidetector CT imaging of the chest, abdomen and pelvis was performed following the standard protocol during bolus administration of intravenous contrast. CONTRAST:  137mL OMNIPAQUE IOHEXOL 300 MG/ML  SOLN COMPARISON:  None FINDINGS: CT CHEST FINDINGS Cardiovascular: Thoracic aorta is normal caliber. No periaortic stranding. Normal appearance of the heart no pericardial effusion. Central pulmonary vasculature is unremarkable. Mediastinum/Nodes: Thoracic inlet structures are normal. No adenopathy in the mediastinum or bilateral hila. No axillary lymphadenopathy. Esophagus is normal by CT. Lungs/Pleura: No consolidation. No pleural effusion. Small nodules in the chest. 2-3 mm nodules in the right lung base (image 112, series 4) (Image 109, series 4) 4 mm right lower lobe pulmonary nodule. Left upper lobe nodule at periphery approximately 3 mm. Musculoskeletal: No chest wall lesion. See below for full musculoskeletal details. CT ABDOMEN PELVIS FINDINGS Hepatobiliary: Liver is normal. Gallbladder is unremarkable. No biliary ductal dilation. Pancreas: Pancreas is normal without focal lesion, ductal dilation or inflammation. Spleen: The spleen is unremarkable with small adjacent splenule. Tiny low-density lesion in the upper portion of the spleen, statistically benign measuring approximately 8 mm. Adrenals/Urinary Tract: Adrenal glands are normal. Kidneys are smooth in terms of contour without  suspicious focal renal lesion. No ureteral calculus or other abnormality. Stomach/Bowel: Gastrointestinal tract is unremarkable. The appendix is normal. Vascular/Lymphatic: No atherosclerosis. No adenopathy. No aneurysmal dilation. No pelvic adenopathy. Reproductive: Prostate is normal. Other: No abdominal wall hernia or abnormality. No abdominopelvic ascites. Musculoskeletal: Spinal degenerative changes. No acute or destructive bone process. IMPRESSION: 1. Tiny pulmonary nodules largest 4 mm as described. No follow-up needed if patient is low-risk (and has no known or suspected primary neoplasm). Non-contrast chest CT can be considered in 12 months if patient is high-risk. This recommendation follows the consensus statement: Guidelines for Management of Incidental Pulmonary Nodules Detected on CT Images: From the Fleischner Society 2017; Radiology 2017; 284:228-243. 2. No acute findings in the chest, abdomen or pelvis. Electronically Signed   By: Zetta Bills M.D.   On: 04/17/2020 12:26    Procedures Procedures (including critical care time)  Medications Ordered in  ED Medications  ketorolac (TORADOL) 30 MG/ML injection 30 mg (30 mg Intravenous Given 04/17/20 1107)  iohexol (OMNIPAQUE) 300 MG/ML solution 100 mL (100 mLs Intravenous Contrast Given 04/17/20 1147)    ED Course  I have reviewed the triage vital signs and the nursing notes.  Pertinent labs & imaging results that were available during my care of the patient were reviewed by me and considered in my medical decision making (see chart for details).    MDM Rules/Calculators/A&P                      Patient with recurrent left lateral chest wall pain.  Symptoms have been waxing and waning for 1 year.  He has previously been evaluated by cardiology and states that he had a televisit with GI although I cannot find notes to support this.   On review of medical records, patient was seen by cardiology, Dr. Wyline Mood in October of last year and had  normal stress test.  Vital signs reviewed.  Since patient does have abdominal complaints as well as recurrent pains to his left chest and history of former smoker.  I will obtain labs and contrasted CT of chest abdomen and pelvis.  No hypoxia, tachycardia or tachypnea. PERC negative  On recheck, patient resting comfortably.  Vital signs remained stable.  Work-up today has been reassuring.  CT of chest does show some small pulmonary nodules.  These are likely benign and not the source of patient's pain.  Given history of smoking and vaping, patient was recommended to have outpatient follow-up for repeat CT of chest as recommended by radiology.  Source of patient's symptoms is unclear, this may be more of a GI issue given that he does have some epigastric pain as well.  He was recommended to continue his PPI, will try muscle relaxer as this may also be a musculoskeletal issue and he was advised to follow-up with GI.  Patient verbalized understanding agreed to plan.  Return precautions were discussed.   Final Clinical Impression(s) / ED Diagnoses Final diagnoses:  Chest wall pain    Rx / DC Orders ED Discharge Orders    None       Pauline Aus, PA-C 04/17/20 1608    Vanetta Mulders, MD 04/22/20 [phone]

## 2020-07-13 IMAGING — CT CT CHEST W/ CM
2 of 4 series · 14 of 36 positions shown, 17 images · IV contrast (omnipaque)
Comparison: None

CLINICAL DATA: Left upper quadrant abdominal pain radiating to the
chest and abdomen for last several months. Last episode last night.

EXAM:
CT CHEST, ABDOMEN, AND PELVIS WITH CONTRAST
TECHNIQUE: Multidetector CT imaging of the chest, abdomen and pelvis was
performed following the standard protocol during bolus
administration of intravenous contrast.
CONTRAST:  100mL OMNIPAQUE IOHEXOL 300 MG/ML  SOLN

[Series 4: lung · axial · 0.74mm/px · z∈[-713,-99]mm · 11 of 369 slices shown, 14 images]
[im 31/369  mediastinal]
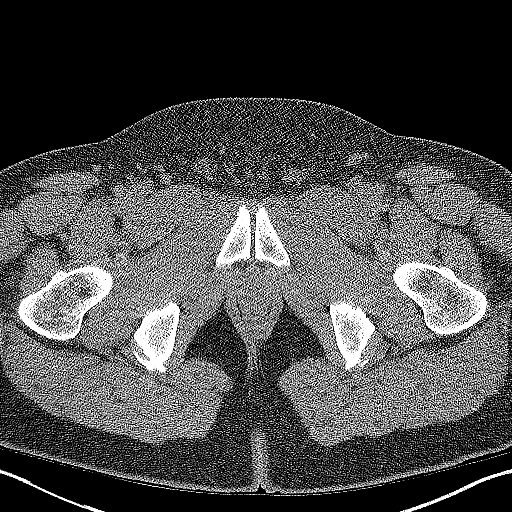
[im 31/369  lung]
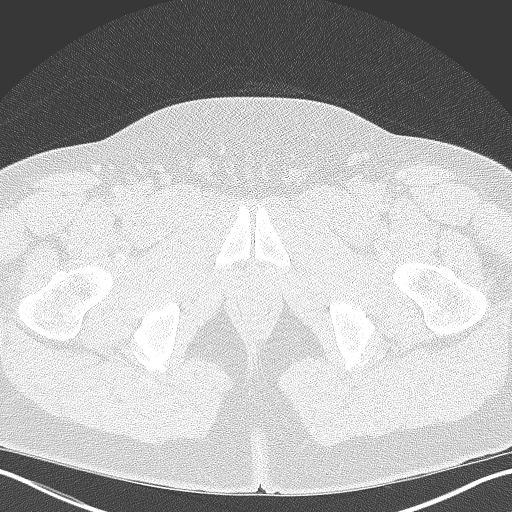
[im 62/369  lung]
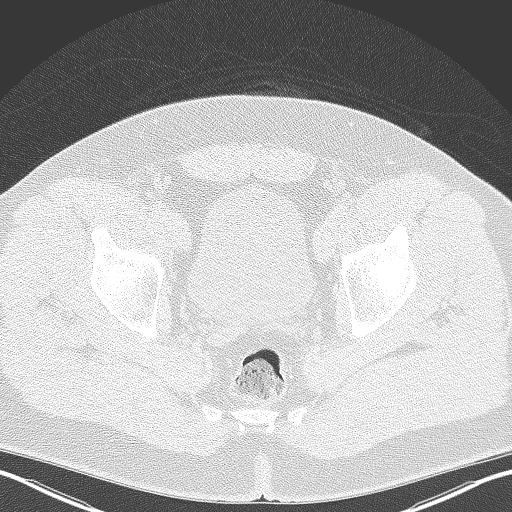
[im 93/369  lung]
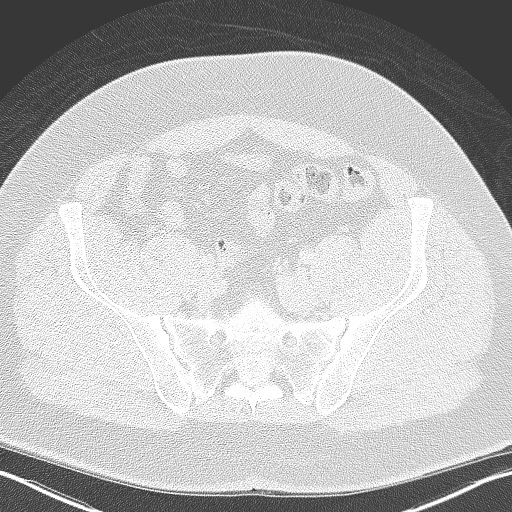
[im 123/369  lung]
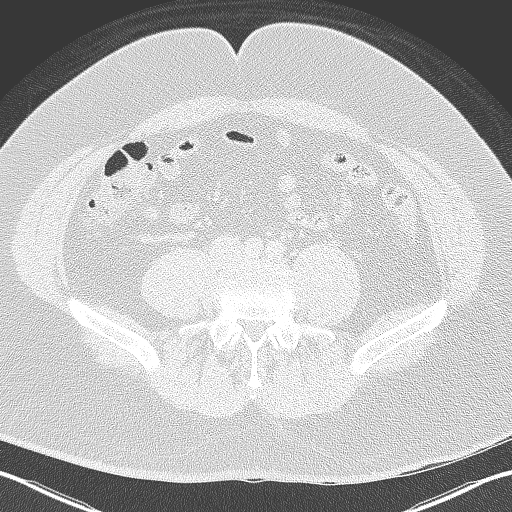
[im 154/369  mediastinal]
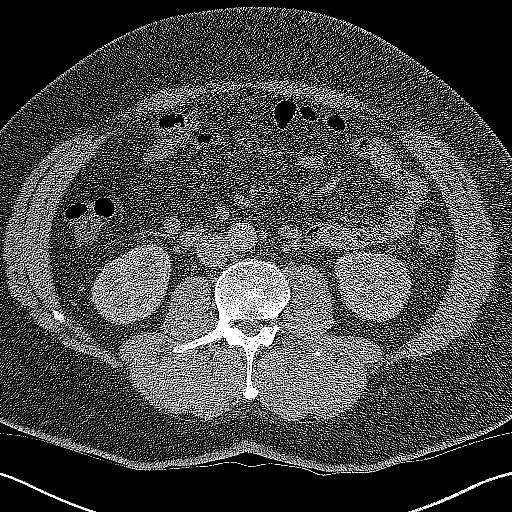
[im 154/369  lung]
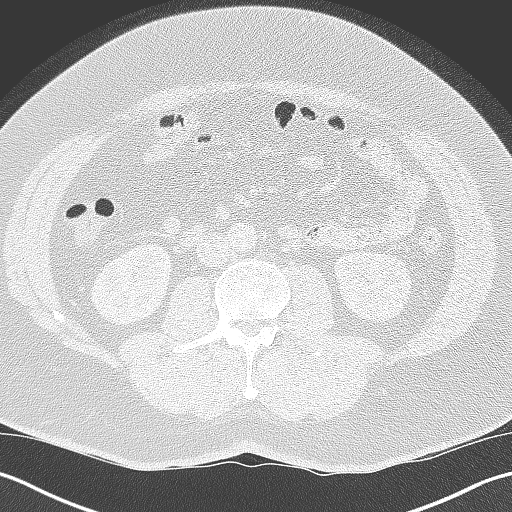
[im 185/369  lung]
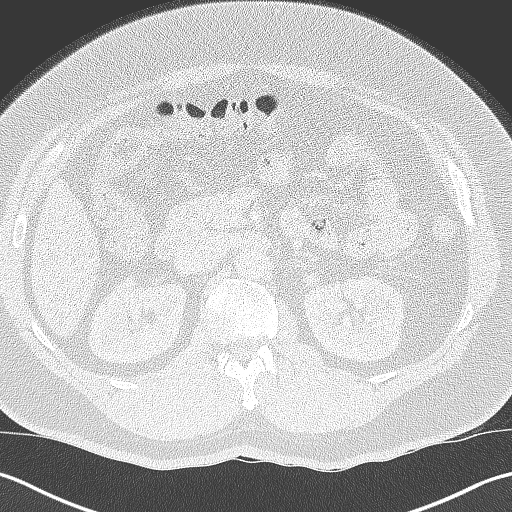
[im 215/369  lung]
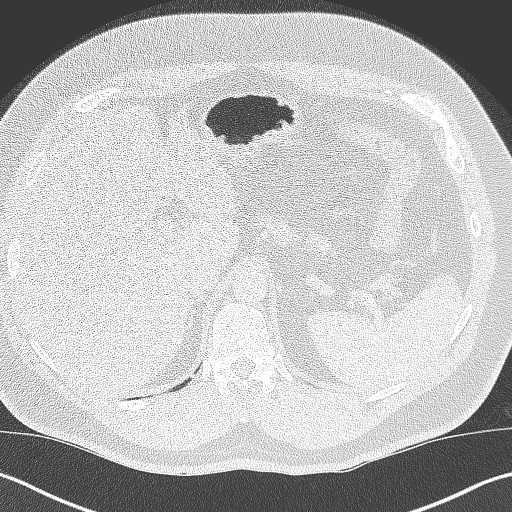
[im 246/369  lung]
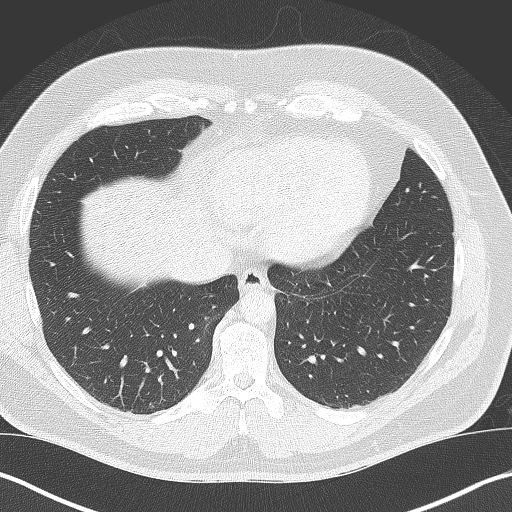
[im 277/369  mediastinal]
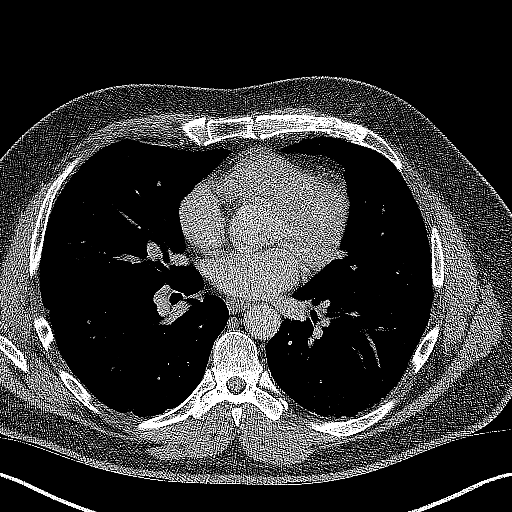
[im 277/369  lung]
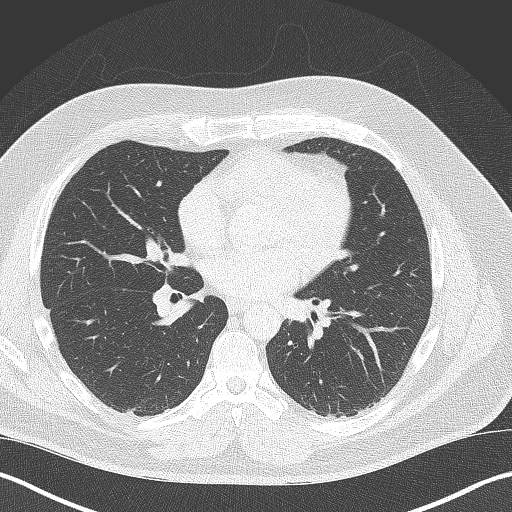
[im 307/369  lung]
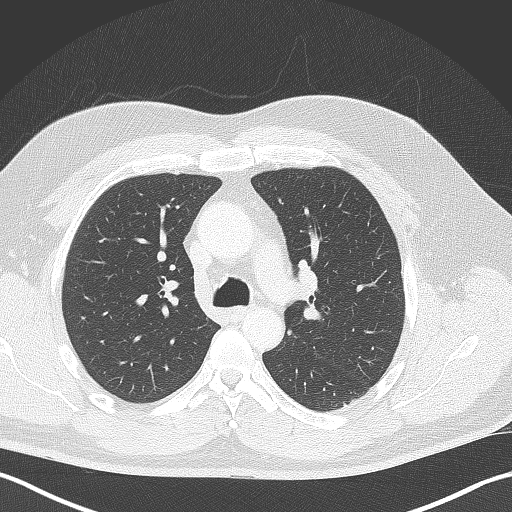
[im 338/369  lung]
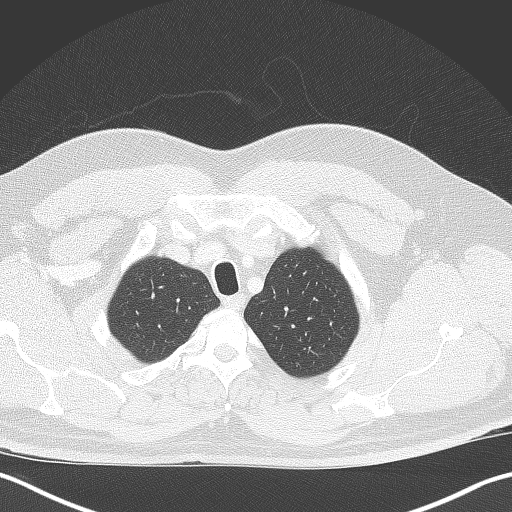

[Series 5: coronals · coronal · 0.98mm/px · 3 of 173 slices shown]
[im 35/173  lung]
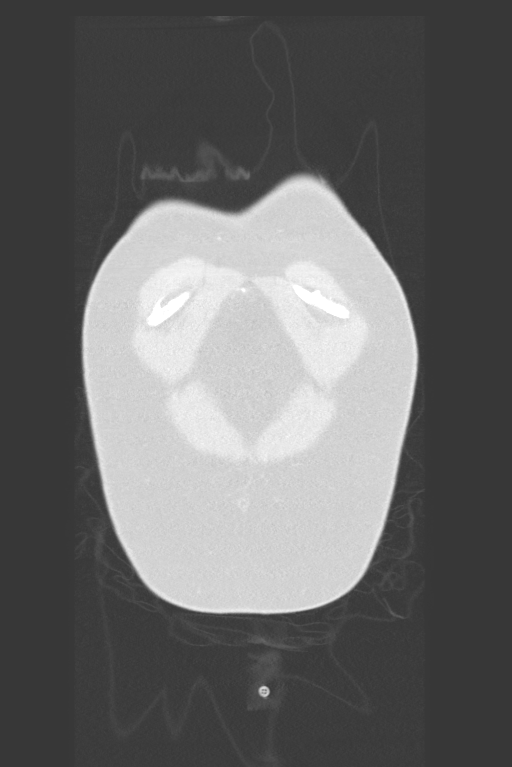
[im 69/173  lung]
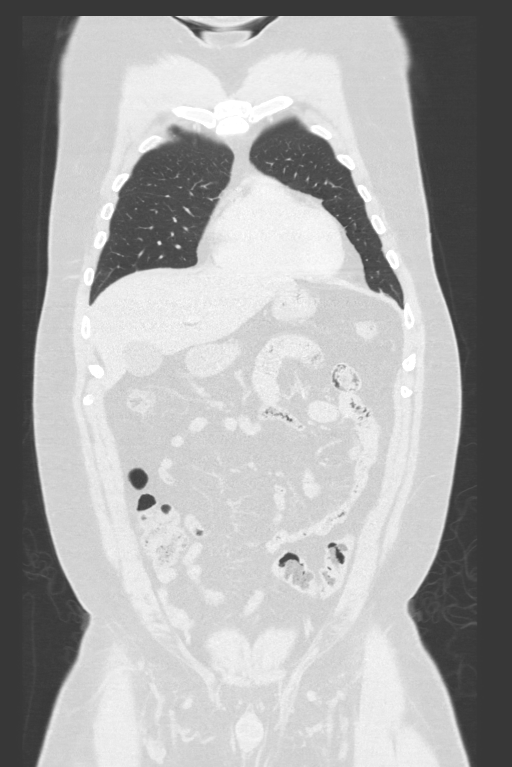
[im 104/173  lung]
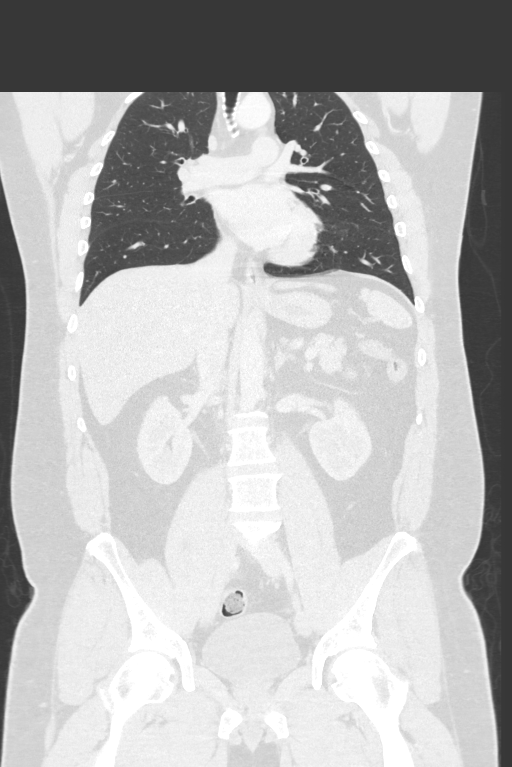

[14 of 36 positions shown; findings below may reference images not displayed]

FINDINGS: CT CHEST FINDINGS

Cardiovascular: Thoracic aorta is normal caliber. No periaortic
stranding. Normal appearance of the heart no pericardial effusion.
Central pulmonary vasculature is unremarkable.

Mediastinum/Nodes: Thoracic inlet structures are normal. No
adenopathy in the mediastinum or bilateral hila. No axillary
lymphadenopathy. Esophagus is normal by CT.

Lungs/Pleura: No consolidation. No pleural effusion. Small nodules
in the chest. 2-3 mm nodules in the right lung base (image 112,
series 4)

(Image 109, series 4) 4 mm right lower lobe pulmonary nodule.

Left upper lobe nodule at periphery approximately 3 mm.

Musculoskeletal: No chest wall lesion. See below for full
musculoskeletal details.

CT ABDOMEN PELVIS FINDINGS

Hepatobiliary: Liver is normal. Gallbladder is unremarkable. No
biliary ductal dilation.

Pancreas: Pancreas is normal without focal lesion, ductal dilation
or inflammation.

Spleen: The spleen is unremarkable with small adjacent splenule.
Tiny low-density lesion in the upper portion of the spleen,
statistically benign measuring approximately 8 mm.

Adrenals/Urinary Tract: Adrenal glands are normal.

Kidneys are smooth in terms of contour without suspicious focal
renal lesion. No ureteral calculus or other abnormality.

Stomach/Bowel: Gastrointestinal tract is unremarkable. The appendix
is normal.

Vascular/Lymphatic: No atherosclerosis. No adenopathy. No aneurysmal
dilation.

No pelvic adenopathy.

Reproductive: Prostate is normal.

Other: No abdominal wall hernia or abnormality. No abdominopelvic
ascites.

Musculoskeletal: Spinal degenerative changes. No acute or
destructive bone process.
IMPRESSION: 1. Tiny pulmonary nodules largest 4 mm as described. No follow-up
needed if patient is low-risk (and has no known or suspected primary
neoplasm). Non-contrast chest CT can be considered in 12 months if
patient is high-risk. This recommendation follows the consensus
statement: Guidelines for Management of Incidental Pulmonary Nodules
Detected on CT Images: From the [HOSPITAL] 7562; Radiology
2. No acute findings in the chest, abdomen or pelvis.

## 2020-07-13 IMAGING — CT CT ABD-PELV W/ CM
2 of 4 series · 14 of 36 positions shown, 17 images · IV contrast (Omnipaque or Isovue)
Comparison: None

CLINICAL DATA: Left upper quadrant abdominal pain radiating to the
chest and abdomen for last several months. Last episode last night.

EXAM:
CT CHEST, ABDOMEN, AND PELVIS WITH CONTRAST
TECHNIQUE: Multidetector CT imaging of the chest, abdomen and pelvis was
performed following the standard protocol during bolus
administration of intravenous contrast.
CONTRAST:  100mL OMNIPAQUE IOHEXOL 300 MG/ML  SOLN

[Series 5: coronals · coronal · 0.98mm/px · 3 of 173 slices shown]
[im 35/173  lung]
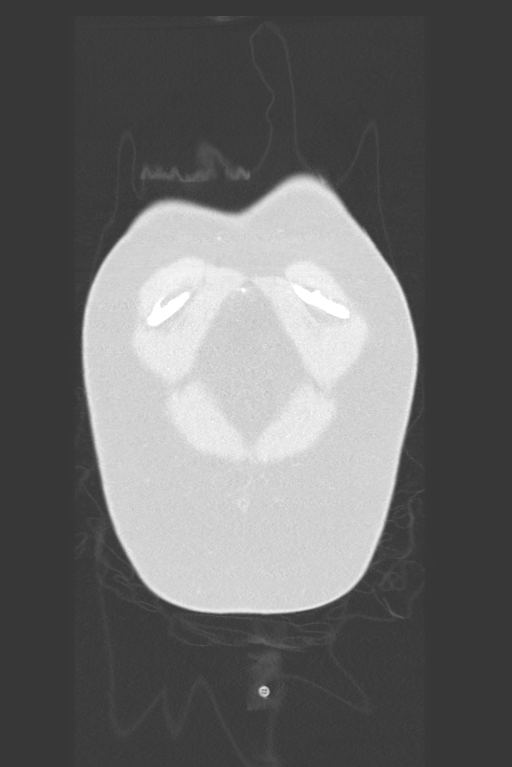
[im 69/173  lung]
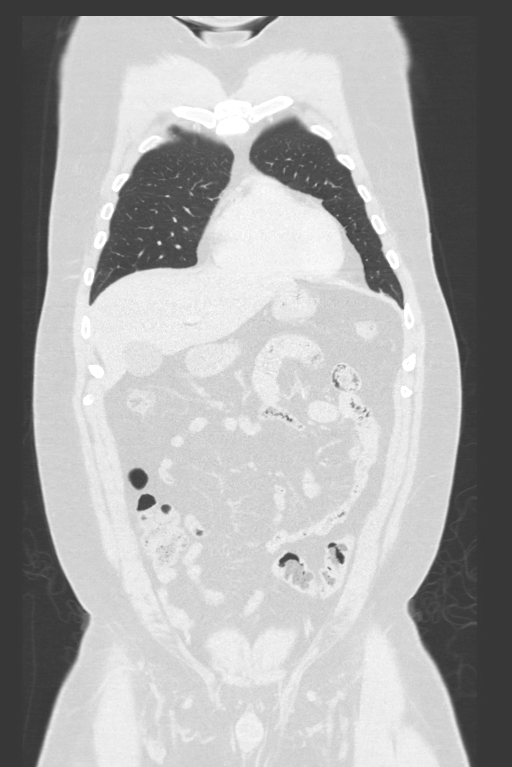
[im 104/173  lung]
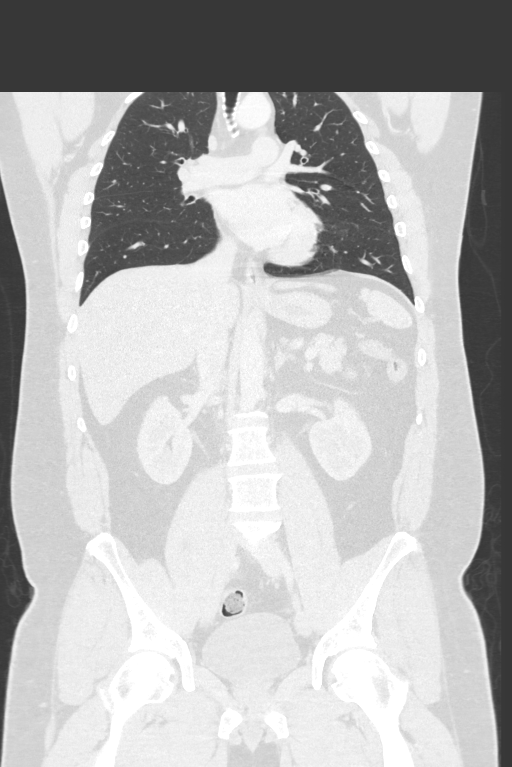

[Series 9: cap with · axial · 0.74mm/px · z∈[-737,-72]mm · 11 of 148 slices shown, 14 images]
[im 8/148  mediastinal]
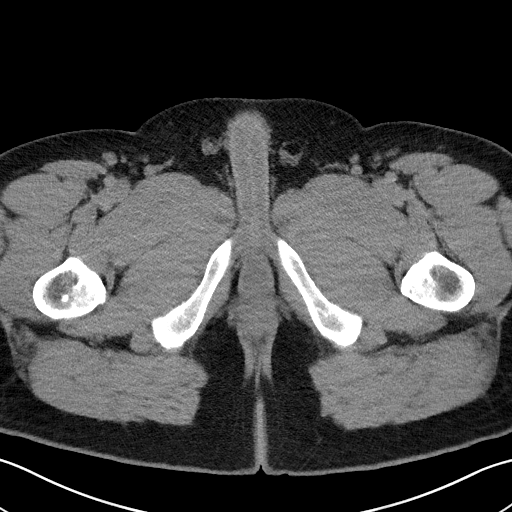
[im 8/148  lung]
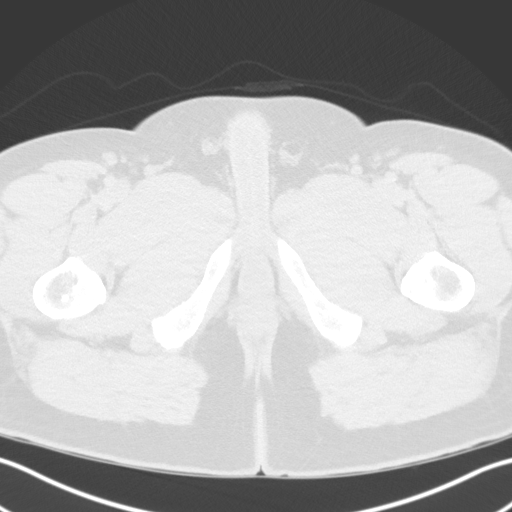
[im 22/148  lung]
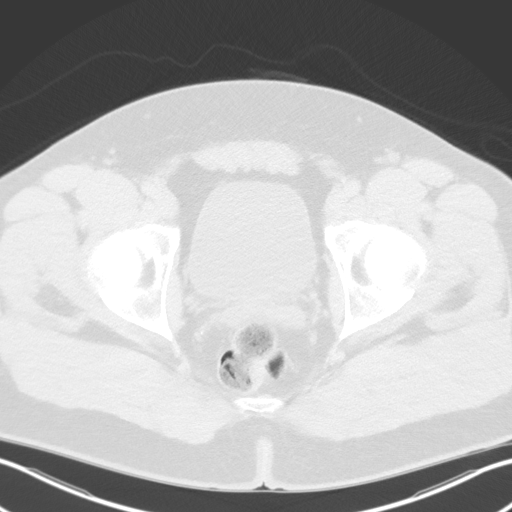
[im 36/148  lung]
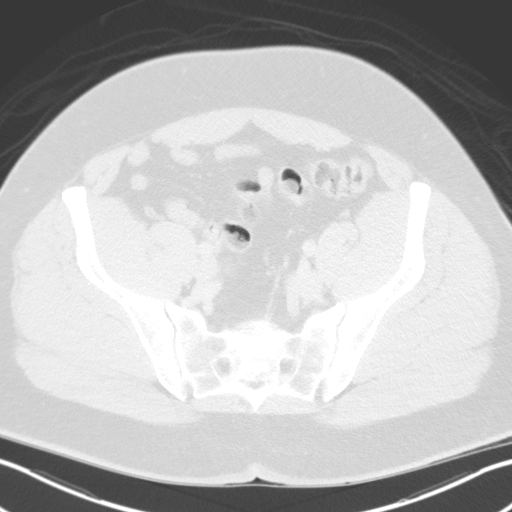
[im 50/148  lung]
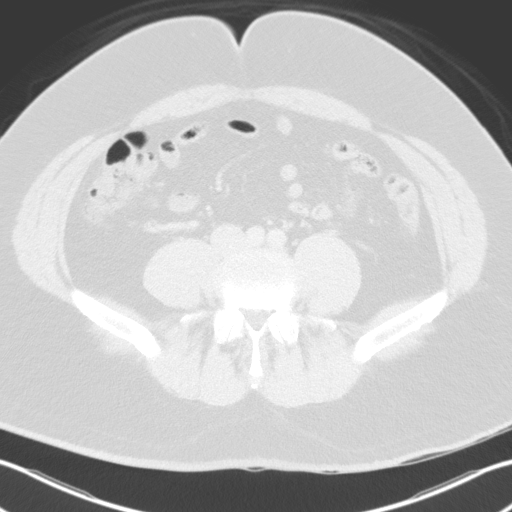
[im 64/148  mediastinal]
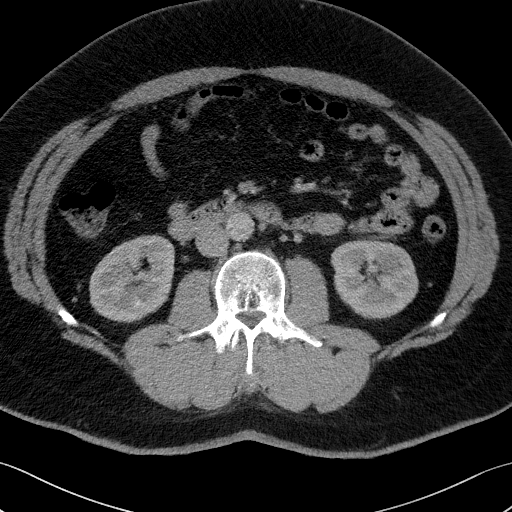
[im 64/148  lung]
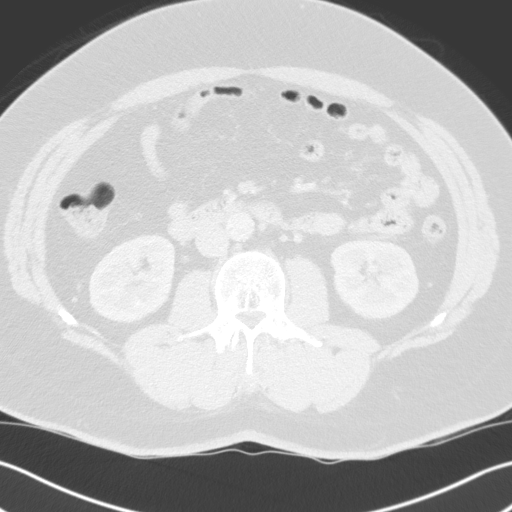
[im 78/148  lung]
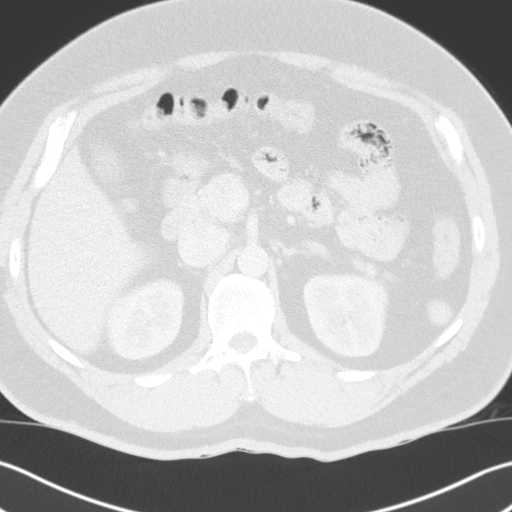
[im 85/148  lung]
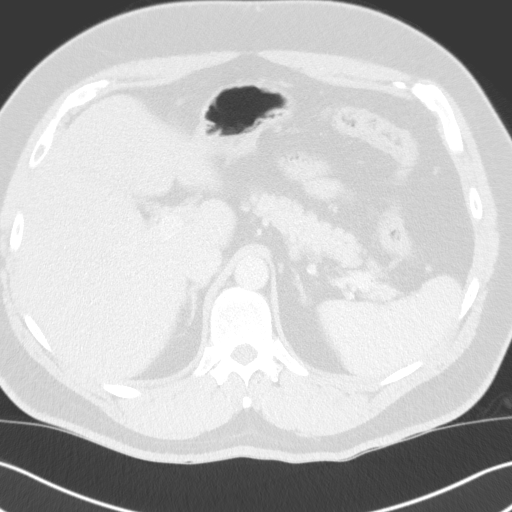
[im 99/148  lung]
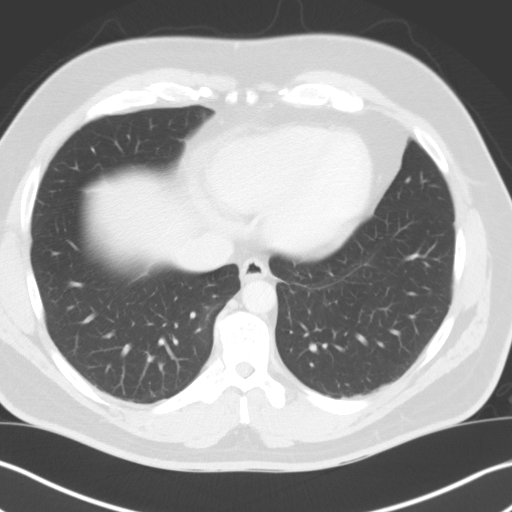
[im 113/148  mediastinal]
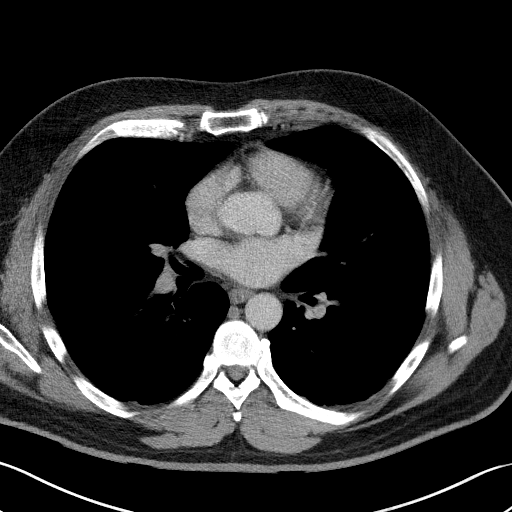
[im 113/148  lung]
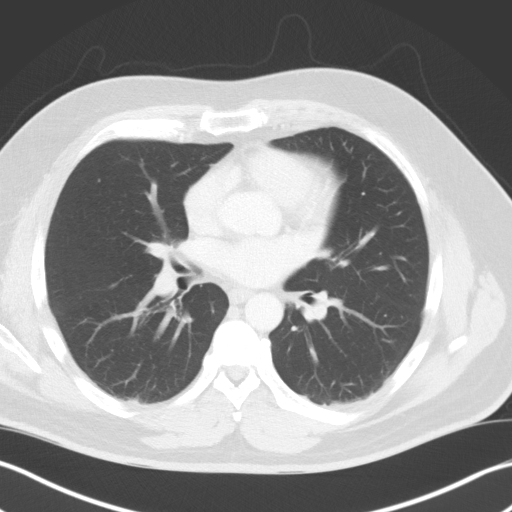
[im 127/148  lung]
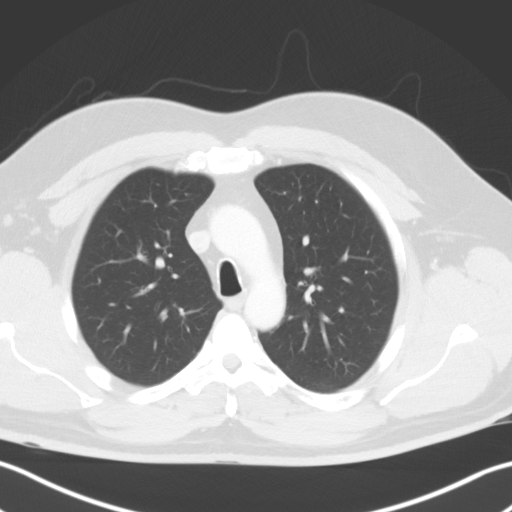
[im 141/148  lung]
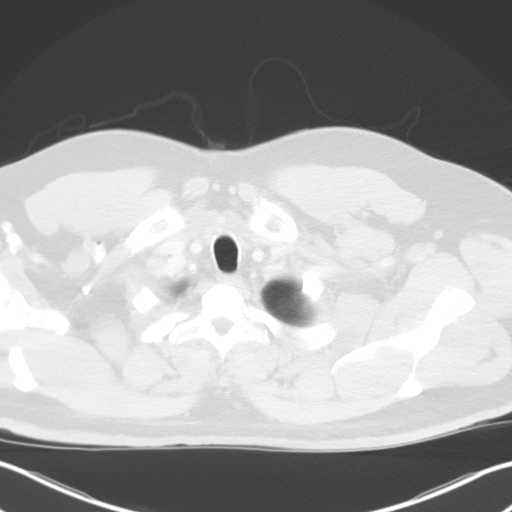

[14 of 36 positions shown; findings below may reference images not displayed]

FINDINGS: CT CHEST FINDINGS

Cardiovascular: Thoracic aorta is normal caliber. No periaortic
stranding. Normal appearance of the heart no pericardial effusion.
Central pulmonary vasculature is unremarkable.

Mediastinum/Nodes: Thoracic inlet structures are normal. No
adenopathy in the mediastinum or bilateral hila. No axillary
lymphadenopathy. Esophagus is normal by CT.

Lungs/Pleura: No consolidation. No pleural effusion. Small nodules
in the chest. 2-3 mm nodules in the right lung base (image 112,
series 4)

(Image 109, series 4) 4 mm right lower lobe pulmonary nodule.

Left upper lobe nodule at periphery approximately 3 mm.

Musculoskeletal: No chest wall lesion. See below for full
musculoskeletal details.

CT ABDOMEN PELVIS FINDINGS

Hepatobiliary: Liver is normal. Gallbladder is unremarkable. No
biliary ductal dilation.

Pancreas: Pancreas is normal without focal lesion, ductal dilation
or inflammation.

Spleen: The spleen is unremarkable with small adjacent splenule.
Tiny low-density lesion in the upper portion of the spleen,
statistically benign measuring approximately 8 mm.

Adrenals/Urinary Tract: Adrenal glands are normal.

Kidneys are smooth in terms of contour without suspicious focal
renal lesion. No ureteral calculus or other abnormality.

Stomach/Bowel: Gastrointestinal tract is unremarkable. The appendix
is normal.

Vascular/Lymphatic: No atherosclerosis. No adenopathy. No aneurysmal
dilation.

No pelvic adenopathy.

Reproductive: Prostate is normal.

Other: No abdominal wall hernia or abnormality. No abdominopelvic
ascites.

Musculoskeletal: Spinal degenerative changes. No acute or
destructive bone process.
IMPRESSION: 1. Tiny pulmonary nodules largest 4 mm as described. No follow-up
needed if patient is low-risk (and has no known or suspected primary
neoplasm). Non-contrast chest CT can be considered in 12 months if
patient is high-risk. This recommendation follows the consensus
statement: Guidelines for Management of Incidental Pulmonary Nodules
Detected on CT Images: From the [HOSPITAL] 7562; Radiology
2. No acute findings in the chest, abdomen or pelvis.

## 2020-09-02 ENCOUNTER — Other Ambulatory Visit: Payer: Self-pay

## 2020-09-02 ENCOUNTER — Encounter (HOSPITAL_COMMUNITY): Payer: Self-pay | Admitting: Emergency Medicine

## 2020-09-02 ENCOUNTER — Emergency Department (HOSPITAL_COMMUNITY)
Admission: EM | Admit: 2020-09-02 | Discharge: 2020-09-02 | Disposition: A | Discharge status: Home / Self Care | Payer: Self-pay | Attending: Emergency Medicine | Admitting: Emergency Medicine

## 2020-09-02 ENCOUNTER — Emergency Department (HOSPITAL_COMMUNITY): Payer: Self-pay

## 2020-09-02 DIAGNOSIS — R079 Chest pain, unspecified: Secondary | ICD-10-CM | POA: Insufficient documentation

## 2020-09-02 DIAGNOSIS — R0789 Other chest pain: Secondary | ICD-10-CM

## 2020-09-02 DIAGNOSIS — Z79899 Other long term (current) drug therapy: Secondary | ICD-10-CM | POA: Insufficient documentation

## 2020-09-02 DIAGNOSIS — I10 Essential (primary) hypertension: Secondary | ICD-10-CM

## 2020-09-02 DIAGNOSIS — Z87891 Personal history of nicotine dependence: Secondary | ICD-10-CM | POA: Insufficient documentation

## 2020-09-02 LAB — TROPONIN I (HIGH SENSITIVITY)
Troponin I (High Sensitivity): 5 ng/L (ref <18)
Troponin I (High Sensitivity): 4 ng/L (ref <18)

## 2020-09-02 LAB — BASIC METABOLIC PANEL
Anion gap: 12 (ref 5–15)
BUN: 15 mg/dL (ref 6–20)
CO2: 25 mmol/L (ref 22–32)
Calcium: 9.4 mg/dL (ref 8.9–10.3)
Chloride: 103 mmol/L (ref 98–111)
Creatinine, Ser: 0.92 mg/dL (ref 0.61–1.24)
GFR calc Af Amer: >60 mL/min (ref >60)
GFR calc non Af Amer: >60 mL/min (ref >60)
Glucose, Bld: 131 mg/dL — ABNORMAL HIGH (ref 70–99)
Potassium: 4.5 mmol/L (ref 3.5–5.1)
Sodium: 140 mmol/L (ref 135–145)

## 2020-09-02 LAB — CBC
HCT: 45 % (ref 39.0–52.0)
Hemoglobin: 15.2 g/dL (ref 13.0–17.0)
MCH: 29.5 pg (ref 26.0–34.0)
MCHC: 33.8 g/dL (ref 30.0–36.0)
MCV: 87.2 fL (ref 80.0–100.0)
Platelets: 194 10*3/uL (ref 150–400)
RBC: 5.16 MIL/uL (ref 4.22–5.81)
RDW: 14.2 % (ref 11.5–15.5)
WBC: 5.3 10*3/uL (ref 4.0–10.5)
nRBC: 0 % (ref 0.0–0.2)

## 2020-09-02 MED ORDER — DICLOFENAC SODIUM 75 MG PO TBEC: 75.0000 mg | DELAYED_RELEASE_TABLET | Freq: Two times a day (BID) | ORAL | 0 refills | Status: DC

## 2020-09-02 MED ORDER — HYDROCHLOROTHIAZIDE 25 MG PO TABS: 25.0000 mg | ORAL_TABLET | Freq: Every day | ORAL | 0 refills | Status: DC

## 2020-09-02 NOTE — ED Triage Notes (Signed)
Pt c/o left chest and left side pain x several months, reports he has been seen and treated a few times with no relief

## 2020-09-02 NOTE — ED Provider Notes (Signed)
[facility] EMERGENCY DEPARTMENT Provider Note   CSN: [id number] Arrival date & time: 09/02/20  1234     History Chief Complaint  Patient presents with  . Chest Pain    Micheal Fangman is a 44 y.o. male.  HPI      Harel Repetto is a 44 y.o. male with past medical history of hypertension who presents to the Emergency Department complaining of recurrent left-sided chest pain.  Pain has been present and waxing and waning for several months.  He has been evaluated at here and at [facility] multiple times for same.  He describes a dull pain to the anterior left chest that radiates laterally.  Dull pain is also associated with intermittent, sharp stabbing type pains of his chest and states his pain usually occurs in the evening hours.  Pain is nonradiating and not associated with movement, deep breathing, arm or jaw pain, or diaphoresis.  Seen by cardiology last October and had normal stress test.  He has not followed up since that time.  Symptoms today are similar to previous.  He denies cough, shortness of breath, fever or chills, abdominal pain, vomiting, flank pain and dysuria.    Past Medical History:  Diagnosis Date  . Hypertension     There are no problems to display for this patient.   History reviewed. No pertinent surgical history.     Family History  Problem Relation Age of Onset  . Hypertension Mother   . Hypertension Father     Social History   Tobacco Use  . Smoking status: Former Games developer  . Smokeless tobacco: Never Used  Vaping Use  . Vaping Use: Former  . Substances: Nicotine-salt  Substance Use Topics  . Alcohol use: Yes    Comment: occ  . Drug use: Never    Home Medications Prior to Admission medications   Medication Sig Start Date End Date Taking? Authorizing Provider  irbesartan (AVAPRO) 150 MG tablet Take 150 mg by mouth at bedtime. 08/22/20  Yes [provider]  omeprazole (PRILOSEC) 40 MG capsule Take 40 mg by mouth daily.    Yes [provider]  lisinopril (PRINIVIL,ZESTRIL) 10 MG tablet Take 1 tablet (10 mg total) by mouth daily. Patient not taking: Reported on 09/02/2020 11/15/18   Vanetta Mulders, MD  methocarbamol (ROBAXIN) 500 MG tablet Take 1 tablet (500 mg total) by mouth 3 (three) times daily. 04/17/20   Jeffry Vogelsang, Babette Relic, PA-C    Allergies    Patient has no known allergies.  Review of Systems   Review of Systems  Constitutional: Negative for chills, fatigue and fever.  HENT: Negative for sore throat and trouble swallowing.   Respiratory: Negative for cough, shortness of breath and wheezing.   Cardiovascular: Positive for chest pain. Negative for palpitations.  Gastrointestinal: Negative for abdominal pain, nausea and vomiting.  Genitourinary: Negative for dysuria, flank pain and hematuria.  Musculoskeletal: Negative for arthralgias, back pain, myalgias, neck pain and neck stiffness.  Skin: Negative for rash.  Neurological: Negative for dizziness, weakness and numbness.  Hematological: Does not bruise/bleed easily.    Physical Exam Updated Vital Signs BP (!) 141/98 (BP Location: Right Arm)   Pulse 72   Temp 98.3 F (36.8 C) (Oral)   Resp 20   Ht 6\' 5"  (1.956 m)   Wt 131.5 kg   SpO2 98%   BMI 34.39 kg/m   Physical Exam Vitals and nursing note reviewed.  Constitutional:      General: He is not in acute  distress.    Appearance: Normal appearance. He is well-developed. He is not ill-appearing or toxic-appearing.  Cardiovascular:     Rate and Rhythm: Normal rate and regular rhythm.     Pulses: Normal pulses.  Pulmonary:     Effort: Pulmonary effort is normal.     Breath sounds: Normal breath sounds.  Chest:     Chest wall: No tenderness.  Abdominal:     General: There is no distension.     Palpations: Abdomen is soft.     Tenderness: There is no abdominal tenderness.  Musculoskeletal:        General: Normal range of motion.     Cervical back: Normal range of motion.      Right lower leg: No edema.     Left lower leg: No edema.  Skin:    General: Skin is warm.     Capillary Refill: Capillary refill takes less than 2 seconds.     Findings: No rash.  Neurological:     General: No focal deficit present.     Mental Status: He is alert.     Sensory: No sensory deficit.     Motor: No weakness.     ED Results / Procedures / Treatments   Labs (all labs ordered are listed, but only abnormal results are displayed) Labs Reviewed  BASIC METABOLIC PANEL - Abnormal; Notable for the following components:      Result Value   Glucose, Bld 131 (*)    All other components within normal limits  CBC  TROPONIN I (HIGH SENSITIVITY)  TROPONIN I (HIGH SENSITIVITY)    EKG EKG Interpretation  Date/Time:  Monday September 02 2020 12:47:16 EDT Ventricular Rate:  82 PR Interval:  160 QRS Duration: 88 QT Interval:  388 QTC Calculation: 453 R Axis:   62 Text Interpretation: Normal sinus rhythm Normal ECG Confirmed by Vanetta Mulders [phone]) on 09/02/2020 4:10:26 PM   Radiology DG Chest 2 View  Result Date: 09/02/2020 CLINICAL DATA:  Left-sided chest pain for several months EXAM: CHEST - 2 VIEW COMPARISON:  04/17/2020 FINDINGS: The heart size and mediastinal contours are within normal limits. Both lungs are clear. The visualized skeletal structures are unremarkable. IMPRESSION: No active cardiopulmonary disease. Electronically Signed   By: Marnee Spring M.D.   On: 09/02/2020 13:09    Procedures Procedures (including critical care time)  Medications Ordered in ED Medications - No data to display  ED Course  I have reviewed the triage vital signs and the nursing notes.  Pertinent labs & imaging results that were available during my care of the patient were reviewed by me and considered in my medical decision making (see chart for details).    MDM Rules/Calculators/A&P                          pt with recurrent left sided chest pain for months.  Seen here  several times for same.  Prior work up from 4/21 included CT chest and abd/pelvis that only showed some small pulmonary nodules. CT chest repeated in July at [facility].  Reviewed care everywhere and patient was seen at [facility] in June and July for left-sided chest pain.  He has also been evaluated by cardiology in October 2020, he has a documented normal stress test. PERC negative.    Heart score today for 1  Work up today included chest x-ray, EKG, troponin, and CBC, BMET, results all unremarkable. Doubt ischemia.  Vitals  reviewed.  Pt appears appropriate for d/c home.  I have recommended close out pt f/u with cards and pt agrees to plan.   Final Clinical Impression(s) / ED Diagnoses Final diagnoses:  Atypical chest pain  Hypertension, unspecified type    Rx / DC Orders ED Discharge Orders    None       Pauline Aus, PA-C 09/04/20 1626    Vanetta Mulders, MD 09/12/20 1047

## 2020-09-02 NOTE — Discharge Instructions (Signed)
As discussed, your work-up today was reassuring.  Your blood pressure today was slightly elevated.  You may add the hydrochlorothiazide to your ibesartan.  This can potentially lower your potassium levels.  You will need to have your potassium level rechecked in 2 weeks.  Call Dr. Verna Czech office to arrange a follow-up appointment regarding your chest pain.  It is important that you establish primary care with a provider.  I have provided a referral list for you.  [facility] Primary Care Doctor List    Kari Baars MD. Specialty: Pulmonary Disease Contact information: [address]  [address]  [place] [address]  [phone]   Syliva Overman, MD. Specialty: [facility] Medicine Contact information: [address]  [place] [address]  [phone]   Lilyan Punt, MD. Specialty: [facility] Medicine Contact information: [address]  [place] [address]  [phone]   Avon Gully, MD Specialty: Internal Medicine Contact information: [address] [place] [address]  [phone]   Catalina Pizza, MD. Specialty: Internal Medicine Contact information: [address]  [place] [address]  [phone]    [facility] (Dr. Selena Batten) Specialty: Family Medicine Contact information: [address]  [place] [address]  [phone]   John Giovanni, MD. Specialty: [facility] Medicine Contact information: [address]  [address]  [place] [address]  [phone]   Carylon Perches, MD. Specialty: Internal Medicine Contact information: [address]  [address]  [place] [address]  [phone]    [facility]  [address] [place], [place] [address] [phone]  Services [facility] offers a variety of basic health services.  Services include but are not limited to: Blood pressure checks  Heart rate checks  Blood sugar checks  Urine analysis   Rapid strep tests  Pregnancy tests.  Health education and referrals  People needing more complex services will be directed to a physician online. Using these virtual visits, doctors can evaluate and prescribe medicine and treatments. There will be no medication on-site, though [facility] will help patients fill their prescriptions at little to no cost.   For More information please go to: [email or web address]

## 2020-09-16 ENCOUNTER — Other Ambulatory Visit: Payer: Self-pay | Admitting: Family Medicine

## 2020-09-16 ENCOUNTER — Ambulatory Visit: Payer: Self-pay

## 2020-09-16 ENCOUNTER — Other Ambulatory Visit: Payer: Self-pay

## 2020-09-16 DIAGNOSIS — Z0000 Encounter for general adult medical examination without abnormal findings: Secondary | ICD-10-CM

## 2020-10-01 ENCOUNTER — Other Ambulatory Visit: Payer: Self-pay | Admitting: *Deleted

## 2020-10-01 ENCOUNTER — Other Ambulatory Visit: Payer: Self-pay

## 2020-10-01 DIAGNOSIS — Z20822 Contact with and (suspected) exposure to covid-19: Secondary | ICD-10-CM

## 2020-10-02 LAB — SARS-COV-2, NAA 2 DAY TAT

## 2020-10-02 LAB — NOVEL CORONAVIRUS, NAA: SARS-CoV-2, NAA: NOT DETECTED

## 2020-10-22 ENCOUNTER — Encounter: Payer: Self-pay | Admitting: *Deleted

## 2020-10-22 ENCOUNTER — Ambulatory Visit (INDEPENDENT_AMBULATORY_CARE_PROVIDER_SITE_OTHER): Payer: Self-pay | Admitting: Cardiology

## 2020-10-22 ENCOUNTER — Encounter: Payer: Self-pay | Admitting: Cardiology

## 2020-10-22 ENCOUNTER — Other Ambulatory Visit: Payer: Self-pay

## 2020-10-22 VITALS — BP 122/88 | HR 78 | Ht 77.0 in | Wt 303.0 lb

## 2020-10-22 DIAGNOSIS — Z8249 Family history of ischemic heart disease and other diseases of the circulatory system: Secondary | ICD-10-CM

## 2020-10-22 DIAGNOSIS — I208 Other forms of angina pectoris: Secondary | ICD-10-CM

## 2020-10-22 DIAGNOSIS — Z01818 Encounter for other preprocedural examination: Secondary | ICD-10-CM

## 2020-10-22 DIAGNOSIS — R079 Chest pain, unspecified: Secondary | ICD-10-CM | POA: Insufficient documentation

## 2020-10-22 DIAGNOSIS — I10 Essential (primary) hypertension: Secondary | ICD-10-CM | POA: Insufficient documentation

## 2020-10-22 DIAGNOSIS — I259 Chronic ischemic heart disease, unspecified: Secondary | ICD-10-CM

## 2020-10-22 MED ORDER — METOPROLOL TARTRATE 50 MG PO TABS: ORAL_TABLET | ORAL | 0 refills | Status: DC

## 2020-10-22 NOTE — Assessment & Plan Note (Signed)
Mother had CAD in her 48's

## 2020-10-22 NOTE — Assessment & Plan Note (Signed)
Controlled.  

## 2020-10-22 NOTE — Progress Notes (Signed)
Cardiology Office Note:    Date:  10/22/2020   ID:  Andrew Fields, DOB Jun 10, 1976, MRN [id number]  PCP:  Patient, No Pcp Per  Cardiologist:  Dina Rich, MD  Electrophysiologist:  None   Referring MD: No ref. provider found   CC: chest pain  History of Present Illness:    Andrew Fields is a 44 y.o. male with a hx of chest pain, hypertension, family history of coronary disease, and anxiety.  He was evaluated last year for chest pain, GXT was low risk.  The patient has been seen in several different ERs for chest pain.  Most recently he was at [facility].  His troponins were negative and his EKG was normal.  His symptoms have some typical and atypical features.  He describes an intermittent left sternal "stabbing pain" but he also has "pressure" and left arm pain associated with shortness of breath.  He says his symptoms have been increasing the last couple weeks.  He has recently started a new job and has had some symptoms at work.  He is concerned about missing time at work.   Past Medical History:  Diagnosis Date  . Hypertension     Past Surgical History:  Procedure Laterality Date  . KNEE ARTHROSCOPY W/ MENISCAL REPAIR Right 06/2019    Current Medications: Current Meds  Medication Sig  . famotidine (PEPCID) 20 MG tablet Take 20 mg by mouth daily.  . hydrochlorothiazide (HYDRODIURIL) 25 MG tablet Take 1 tablet (25 mg total) by mouth daily.  Marland Kitchen losartan (COZAAR) 100 MG tablet Take 100 mg by mouth daily.     Allergies:   Patient has no known allergies.   Social History   Socioeconomic History  . Marital status: Married    Spouse name: Not on file  . Number of children: Not on file  . Years of education: Not on file  . Highest education level: Not on file  Occupational History  . Not on file  Tobacco Use  . Smoking status: Former Games developer  . Smokeless tobacco: Never Used  Vaping Use  . Vaping Use: Former  . Substances: Nicotine-salt  Substance and Sexual  Activity  . Alcohol use: Yes    Comment: occ  . Drug use: Never  . Sexual activity: Not on file  Other Topics Concern  . Not on file  Social History Narrative  . Not on file   Social Determinants of Health   Financial Resource Strain:   . Difficulty of Paying Living Expenses: Not on file  Food Insecurity:   . Worried About Programme researcher, broadcasting/film/video in the Last Year: Not on file  . Ran Out of Food in the Last Year: Not on file  Transportation Needs:   . Lack of Transportation (Medical): Not on file  . Lack of Transportation (Non-Medical): Not on file  Physical Activity:   . Days of Exercise per Week: Not on file  . Minutes of Exercise per Session: Not on file  Stress:   . Feeling of Stress : Not on file  Social Connections:   . Frequency of Communication with Friends and Family: Not on file  . Frequency of Social Gatherings with Friends and Family: Not on file  . Attends Religious Services: Not on file  . Active Member of Clubs or Organizations: Not on file  . Attends Banker Meetings: Not on file  . Marital Status: Not on file     Family History: The patient's family history includes Hypertension  in his father and mother.his mother had CAD in her 74's and his father had CAD as well.  ROS:   Please see the history of present illness.    All other systems reviewed and are negative.  EKGs/Labs/Other Studies Reviewed:    The following studies were reviewed today: 10/17/2019-GXT  Blood pressure demonstrated a hypertensive response to exercise.  There was no ST segment deviation noted during stress.  Negative exercise stress test for ischemia. Duke treadmill score of 7 supports low risk for major cardiac events. Exercise capacity is 90% of predicted based on age and gender   EKG:  EKG is not ordered today.  The ekg ordered 10/19/2020 demonstrates NSR SB 54  Recent Labs: 03/04/2020: B Natriuretic Peptide 14.0; Magnesium 2.1 04/17/2020: ALT 47 09/02/2020: BUN 15;  Creatinine, Ser 0.92; Hemoglobin 15.2; Platelets 194; Potassium 4.5; Sodium 140  Recent Lipid Panel No results found for: CHOL, TRIG, HDL, CHOLHDL, VLDL, LDLCALC, LDLDIRECT  Physical Exam:    VS:  BP 122/88   Pulse 78   Ht 6\' 5"  (1.956 m)   Wt (!) 303 lb (137.4 kg)   SpO2 97%   BMI 35.93 kg/m     Wt Readings from Last 3 Encounters:  10/22/20 (!) 303 lb (137.4 kg)  09/02/20 290 lb (131.5 kg)  04/17/20 285 lb (129.3 kg)     GEN: Overweight caucasian male, well developed in no acute distress, anxious HEENT: Normal NECK: No JVD; No carotid bruits CARDIAC: RRR, no murmurs, rubs, gallops RESPIRATORY:  Clear to auscultation without rales, wheezing or rhonchi  ABDOMEN: Soft, non-tender, non-distended MUSCULOSKELETAL:  No edema; No deformity  SKIN: Warm and dry NEUROLOGIC:  Alert and oriented x 3 PSYCHIATRIC:  Normal affect   ASSESSMENT:    Chest pain with moderate risk of acute coronary syndrome Some typical, some atypical features. Negative GXT stress Oct 2020 Risk factors include HTN and FMHx of CAD. Recurrent ED visits for same- [facility]  Essential hypertension Controlled  Family history of early CAD Mother had CAD in her 63's  PLAN:    Will arrange for coronary CTA.  I would suggest 50 mg of Metoprolol instead of 100 mg as he had documented bradycardia in the ED a few days ago.    Medication Adjustments/Labs and Tests Ordered: Current medicines are reviewed at length with the patient today.  Concerns regarding medicines are outlined above.  Orders Placed This Encounter  Procedures  . CT CORONARY MORPH W/CTA COR W/SCORE W/CA W/CM &/OR WO/CM  . CT CORONARY FRACTIONAL FLOW RESERVE DATA PREP  . CT CORONARY FRACTIONAL FLOW RESERVE FLUID ANALYSIS  . Basic Metabolic Panel (BMET)   Meds ordered this encounter  Medications  . metoprolol tartrate (LOPRESSOR) 50 MG tablet    Sig: Take 50 mg 2 hours before procedure    Dispense:  1 tablet     Refill:  0    There are no Patient Instructions on file for this visit.   Signed, 43's, PA-C  10/22/2020 4:15 PM    [facility]

## 2020-10-22 NOTE — Patient Instructions (Signed)
Medication Instructions:  Your physician recommends that you continue on your current medications as directed. Please refer to the Current Medication list given to you today.  *If you need a refill on your cardiac medications before your next appointment, please call your pharmacy*   Lab Work: Your physician recommends that you return for lab work in: One week prior to Cardiac CT   If you have labs (blood work) drawn today and your tests are completely normal, you will receive your results only by: Marland Kitchen MyChart Message (if you have MyChart) OR . A paper copy in the mail If you have any lab test that is abnormal or we need to change your treatment, we will call you to review the results.   Testing/Procedures: Cardiac CT    Follow-Up: At [facility], you and your health needs are our priority.  As part of our continuing mission to provide you with exceptional heart care, we have created designated Provider Care Teams.  These Care Teams include your primary Cardiologist (physician) and Advanced Practice Providers (APPs -  Physician Assistants and Nurse Practitioners) who all work together to provide you with the care you need, when you need it.  We recommend signing up for the patient portal called "MyChart".  Sign up information is provided on this After Visit Summary.  MyChart is used to connect with patients for Virtual Visits (Telemedicine).  Patients are able to view lab/test results, encounter notes, upcoming appointments, etc.  Non-urgent messages can be sent to your provider as well.   To learn more about what you can do with MyChart, go to [email or web address].    Your next appointment:   3 month(s)  The format for your next appointment:   In Person  Provider:   Carlyle Dolly, MD   Other Instructions Thank you for choosing [facility]!  Your cardiac CT will be scheduled at one of the below locations:   [facility] [address] [place], [place] [address] [phone]  [place] [address] [place], [place] [address] [phone]  If scheduled at [facility], please arrive at the [facility] main entrance of [facility] 30 minutes prior to test start time. Proceed to the [facility] Radiology Department (first floor) to check-in and test prep.  If scheduled at [facility], please arrive 15 mins early for check-in and test prep.  Please follow these instructions carefully (unless otherwise directed):  Hold all erectile dysfunction medications at least 3 days (72 hrs) prior to test.  On the Night Before the Test: . Be sure to Drink plenty of water. . Do not consume any caffeinated/decaffeinated beverages or chocolate 12 hours prior to your test. . Do not take any antihistamines 12 hours prior to your test.  On the Day of the Test: . Drink plenty of water. Do not drink any water within one hour of the test. . Do not eat any food 4 hours prior to the test. . You may take your regular medications prior to the test.  . Take metoprolol (Lopressor) two hours prior to test. . HOLD Furosemide/Hydrochlorothiazide morning of the test.   *For Clinical Staff only. Please instruct patient the following:*        -Drink plenty of water       -Hold Furosemide/hydrochlorothiazide morning of the test       -Take metoprolol (Lopressor) 2 hours prior to test (if applicable).                  -  If HR is less than 55 BPM- No Lopressor                -IF HR is greater than 55 BPM and patient is less than or equal to 16 yrs old Lopressor 14m x1.                -If HR is greater than 55 BPM and patient is greater than 772yrs old Lopressor 50 mg x1.     Do not give Lopressor to patients with an allergy to lopressor or anyone with asthma or active COPD symptoms (currently taking steroids).       After the Test: . Drink plenty  of water. . After receiving IV contrast, you may experience a mild flushed feeling. This is normal. . On occasion, you may experience a mild rash up to 24 hours after the test. This is not dangerous. If this occurs, you can take Benadryl 25 mg and increase your fluid intake. . If you experience trouble breathing, this can be serious. If it is severe call 911 IMMEDIATELY. If it is mild, please call our office. . If you take any of these medications: Glipizide/Metformin, Avandament, Glucavance, please do not take 48 hours after completing test unless otherwise instructed.   Once we have confirmed authorization from your insurance company, we will call you to set up a date and time for your test. Based on how quickly your insurance processes prior authorizations requests, please allow up to 4 weeks to be contacted for scheduling your Cardiac CT appointment. Be advised that routine Cardiac CT appointments could be scheduled as many as 8 weeks after your provider has ordered it.  For non-scheduling related questions, please contact the cardiac imaging nurse navigator should you have any questions/concerns: SMarchia Bond Cardiac Imaging Nurse Navigator MBurley Saver Interim Cardiac Imaging Nurse [facility] Direct Office Dial: [phone]  For scheduling needs, including cancellations and rescheduling, please call TVivien Rotaat [phone] option 3.

## 2020-10-22 NOTE — Assessment & Plan Note (Signed)
Some typical, some atypical features. Negative GXT stress Oct 2020 Risk factors include HTN and FMHx of CAD. Recurrent ED visits for same- last 10/23/2021[facility]

## 2020-10-30 ENCOUNTER — Telehealth (HOSPITAL_COMMUNITY): Payer: Self-pay | Admitting: Emergency Medicine

## 2020-10-30 ENCOUNTER — Ambulatory Visit: Payer: Self-pay | Admitting: Physician Assistant

## 2020-10-30 NOTE — Telephone Encounter (Signed)
Attempted to call patient regarding upcoming cardiac CT appointment. °Left message on voicemail with name and callback number °Tam Delisle RN Navigator Cardiac Imaging °[facility] °[phone] Office °[phone] Cell ° °

## 2020-11-01 ENCOUNTER — Encounter: Payer: Self-pay | Admitting: *Deleted

## 2020-11-01 ENCOUNTER — Ambulatory Visit (HOSPITAL_COMMUNITY)
Admission: RE | Admit: 2020-11-01 | Discharge: 2020-11-01 | Disposition: A | Discharge status: Home / Self Care | Payer: Managed Care, Other (non HMO) | Source: Ambulatory Visit | Attending: Cardiology | Admitting: Cardiology

## 2020-11-01 ENCOUNTER — Other Ambulatory Visit: Payer: Self-pay

## 2020-11-01 DIAGNOSIS — Z006 Encounter for examination for normal comparison and control in clinical research program: Secondary | ICD-10-CM

## 2020-11-01 DIAGNOSIS — I259 Chronic ischemic heart disease, unspecified: Secondary | ICD-10-CM | POA: Insufficient documentation

## 2020-11-01 MED ORDER — NITROGLYCERIN 0.4 MG SL SUBL
SUBLINGUAL_TABLET | SUBLINGUAL | Status: AC
  Filled 2020-11-01: qty 2

## 2020-11-01 MED ORDER — NITROGLYCERIN 0.4 MG SL SUBL
0.8000 mg | SUBLINGUAL_TABLET | Freq: Once | SUBLINGUAL | Status: AC
  Administered 2020-11-01: 0.8 mg via SUBLINGUAL

## 2020-11-01 MED ORDER — NITROGLYCERIN 0.4 MG SL SUBL: 0.8000 mg | SUBLINGUAL_TABLET | Freq: Once | SUBLINGUAL | Status: DC

## 2020-11-01 MED ORDER — IOHEXOL 350 MG/ML SOLN
100.0000 mL | Freq: Once | INTRAVENOUS | Status: AC | PRN
  Administered 2020-11-01: 100 mL via INTRAVENOUS

## 2020-11-01 NOTE — Research (Signed)
IDENTIFY Informed Consent                  Subject Name:   Andrew Fields   Subject met inclusion and exclusion criteria.  The informed consent form, study requirements and expectations were reviewed with the subject and questions and concerns were addressed prior to the signing of the consent form.  The subject verbalized understanding of the trial requirements.  The subject agreed to participate in the IDENTIFY trial and signed the informed consent.  The informed consent was obtained prior to performance of any protocol-specific procedures for the subject.  A copy of the signed informed consent was given to the subject and a copy was placed in the subject's medical record.   [place] Arly Salminen, Research Assistant  11-01-2020 13:15 p.m.

## 2020-11-05 ENCOUNTER — Telehealth: Payer: Self-pay | Admitting: Cardiology

## 2020-11-05 ENCOUNTER — Telehealth: Payer: Self-pay | Admitting: Physician Assistant

## 2020-11-05 NOTE — Telephone Encounter (Signed)
Left message on vm that full results are not available and once reviewed by provider, we would give him a call.

## 2020-11-05 NOTE — Telephone Encounter (Signed)
See last phone note with response

## 2020-11-05 NOTE — Telephone Encounter (Signed)
Patient called requesting test results of recent CT ordered by Corine Shelter.

## 2020-11-05 NOTE — Telephone Encounter (Signed)
New message    Patient wants to know if you got the results back for test ?  Leave detailed message on his voicemail , he is at work

## 2020-11-06 ENCOUNTER — Telehealth: Payer: Self-pay | Admitting: Cardiology

## 2020-11-06 NOTE — Telephone Encounter (Signed)
Pt notified that results have not been resulted to the nursing staff for cardiac CT. Informed pt that results to be called as soon as they are resulted.

## 2020-11-06 NOTE — Telephone Encounter (Signed)
New message    Patient calling for CT test results

## 2020-11-06 NOTE — Telephone Encounter (Signed)
Returned pt call. No answer. Left msg to call back.  

## 2020-11-06 NOTE — Telephone Encounter (Signed)
PATIENT CALLED BACK RETURNING CALL

## 2020-11-07 NOTE — Telephone Encounter (Signed)
Advised that available part of cardiac ct has not been resulted by provider yet. Also advised that we are still awaiting FFR result and this may take up to 2 weeks. Verbalized understanding.

## 2020-11-12 ENCOUNTER — Telehealth: Payer: Self-pay | Admitting: Cardiology

## 2020-11-12 NOTE — Telephone Encounter (Signed)
I will forward to [facility], PA-C who ordered test

## 2020-11-12 NOTE — Telephone Encounter (Signed)
New message     Patient called to get his test results he has been waiting 2 weeks

## 2020-11-13 ENCOUNTER — Telehealth: Payer: Self-pay | Admitting: *Deleted

## 2020-11-13 ENCOUNTER — Telehealth: Payer: Self-pay | Admitting: Cardiology

## 2020-11-13 DIAGNOSIS — R079 Chest pain, unspecified: Secondary | ICD-10-CM

## 2020-11-13 NOTE — Telephone Encounter (Signed)
Called the patient to let him know his Coronary CT showed no evidence of CAD and to apologize for the long wait to get the results.  No answer, unable to leave a message.  Corine Shelter PA-C 11/13/2020 9:02 AM

## 2020-11-13 NOTE — Telephone Encounter (Signed)
Corine Shelter, PA-C spoke with pt and results were given

## 2020-11-13 NOTE — Telephone Encounter (Signed)
-----   Message from Abelino Derrick, [place] sent at 11/13/2020  9:16 AM EST ----- I called the patient and reviewed his coronary CT results with him.  He continues to complain of chest tightness.  Please order an echo for chest pain and arrange a f/u with Dr Wyline Mood or an APP after this.   Corine Shelter PA-C 11/13/2020 9:16 AM

## 2020-11-25 ENCOUNTER — Other Ambulatory Visit (HOSPITAL_COMMUNITY): Payer: Managed Care, Other (non HMO)

## 2020-11-27 NOTE — Progress Notes (Deleted)
Cardiology Clinic Note   Patient Name: Andrew Fields Date of Encounter: 11/27/2020  Primary Care Provider:  Patient, No Pcp Per Primary Cardiologist:  Dina Rich, MD  Patient Profile    Andrew Fields 44 year old male presents the clinic today for follow-up evaluation of his essential hypertension.  Past Medical History    Past Medical History:  Diagnosis Date  . Hypertension    Past Surgical History:  Procedure Laterality Date  . KNEE ARTHROSCOPY W/ MENISCAL REPAIR Right 06/2019    Allergies  No Known Allergies  History of Present Illness    Andrew Fields has a PMH of essential hypertension, chest pain, and family history of CAD.  He was last seen by Corine Shelter, PA-C on 10/22/2020.  During that time he described him intermittent left sternal stabbing type pain/pressure.  He also described an associated left arm pain with shortness of breath.  He described an increase in his symptoms over the last several weeks.  He also reported starting a new job and having these symptoms while at work.  He was concerned about missing work.  He was previously seen and evaluated with GXT 2020 which showed low risk.  He also has several visits to the ER for chest pain.  He recently presented to [facility] with chest discomfort.  Troponins were negative and his EKG was normal at that time.  His symptoms were noted to be somewhat typical and also atypical.  He underwent coronary CTA 11/01/2020 which showed a coronary calcium score of 0.  No evidence of coronary artery disease  He presents to the clinic today for follow-up evaluation and states***  *** denies chest pain, shortness of breath, lower extremity edema, fatigue, palpitations, melena, hematuria, hemoptysis, diaphoresis, weakness, presyncope, syncope, orthopnea, and PND.   Home Medications    Prior to Admission medications   Medication Sig Start Date End Date Taking? Authorizing Provider  diclofenac (VOLTAREN) 75 MG EC  tablet Take 1 tablet (75 mg total) by mouth 2 (two) times daily. Take with food Patient not taking: Reported on 10/22/2020 09/02/20   Triplett, Tammy, PA-C  famotidine (PEPCID) 20 MG tablet Take 20 mg by mouth daily. 10/03/20   [provider]  hydrochlorothiazide (HYDRODIURIL) 25 MG tablet Take 1 tablet (25 mg total) by mouth daily. 09/02/20   Triplett, Tammy, PA-C  losartan (COZAAR) 100 MG tablet Take 100 mg by mouth daily. 10/03/20   [provider]  metoprolol tartrate (LOPRESSOR) 50 MG tablet Take 50 mg 2 hours before procedure 10/22/20   Corine Shelter K, PA-C  lisinopril (PRINIVIL,ZESTRIL) 10 MG tablet Take 1 tablet (10 mg total) by mouth daily. Patient not taking: Reported on 09/02/2020 11/15/18 09/02/20  Vanetta Mulders, MD    Family History    Family History  Problem Relation Age of Onset  . Hypertension Mother   . Hypertension Father    He indicated that his mother is deceased. He indicated that his father is alive. He indicated that his brother is deceased.  Social History    Social History   Socioeconomic History  . Marital status: Married    Spouse name: Not on file  . Number of children: Not on file  . Years of education: Not on file  . Highest education level: Not on file  Occupational History  . Not on file  Tobacco Use  . Smoking status: Former Games developer  . Smokeless tobacco: Never Used  Vaping Use  . Vaping Use: Former  . Substances: Nicotine-salt  Substance and Sexual Activity  . Alcohol use: Yes    Comment: occ  . Drug use: Never  . Sexual activity: Not on file  Other Topics Concern  . Not on file  Social History Narrative  . Not on file   Social Determinants of Health   Financial Resource Strain:   . Difficulty of Paying Living Expenses: Not on file  Food Insecurity:   . Worried About Programme researcher, broadcasting/film/video in the Last Year: Not on file  . Ran Out of Food in the Last Year: Not on file  Transportation Needs:   . Lack of Transportation  (Medical): Not on file  . Lack of Transportation (Non-Medical): Not on file  Physical Activity:   . Days of Exercise per Week: Not on file  . Minutes of Exercise per Session: Not on file  Stress:   . Feeling of Stress : Not on file  Social Connections:   . Frequency of Communication with Friends and Family: Not on file  . Frequency of Social Gatherings with Friends and Family: Not on file  . Attends Religious Services: Not on file  . Active Member of Clubs or Organizations: Not on file  . Attends Banker Meetings: Not on file  . Marital Status: Not on file  Intimate Partner Violence:   . Fear of Current or Ex-Partner: Not on file  . Emotionally Abused: Not on file  . Physically Abused: Not on file  . Sexually Abused: Not on file     Review of Systems    General:  No chills, fever, night sweats or weight changes.  Cardiovascular:  No chest pain, dyspnea on exertion, edema, orthopnea, palpitations, paroxysmal nocturnal dyspnea. Dermatological: No rash, lesions/masses Respiratory: No cough, dyspnea Urologic: No hematuria, dysuria Abdominal:   No nausea, vomiting, diarrhea, bright red blood per rectum, melena, or hematemesis Neurologic:  No visual changes, wkns, changes in mental status. All other systems reviewed and are otherwise negative except as noted above.  Physical Exam    VS:  There were no vitals taken for this visit. , BMI There is no height or weight on file to calculate BMI. GEN: Well nourished, well developed, in no acute distress. HEENT: normal. Neck: Supple, no JVD, carotid bruits, or masses. Cardiac: RRR, no murmurs, rubs, or gallops. No clubbing, cyanosis, edema.  Radials/DP/PT 2+ and equal bilaterally.  Respiratory:  Respirations regular and unlabored, clear to auscultation bilaterally. GI: Soft, nontender, nondistended, BS + x 4. MS: no deformity or atrophy. Skin: warm and dry, no rash. Neuro:  Strength and sensation are intact. Psych: Normal  affect.  Accessory Clinical Findings    Recent Labs: 03/04/2020: B Natriuretic Peptide 14.0; Magnesium 2.1 04/17/2020: ALT 47 09/02/2020: BUN 15; Creatinine, Ser 0.92; Hemoglobin 15.2; Platelets 194; Potassium 4.5; Sodium 140   Recent Lipid Panel No results found for: CHOL, TRIG, HDL, CHOLHDL, VLDL, LDLCALC, LDLDIRECT  ECG personally reviewed by me today- *** - No acute changes  Coronary CTA 11/01/2020 EXAM: Cardiac/Coronary  CT  TECHNIQUE: The patient was scanned on a [employer].  FINDINGS: A 120 kV prospective scan was triggered in the descending thoracic aorta at 111 HU's. Axial non-contrast 3 mm slices were carried out through the heart. The data set was analyzed on a dedicated work station and scored using the Agatson method. Gantry rotation speed was 250 msecs and collimation was .6 mm. No beta blockade and 0.8 mg of sl NTG was given. The 3D data set was reconstructed  in 5% intervals of the 67-82 % of the R-R cycle. Diastolic phases were analyzed on a dedicated work station using MPR, MIP and VRT modes. The patient received 80 cc of contrast.  Aorta:  Normal size.  No calcifications.  No dissection.  Aortic Valve:  Trileaflet.  No calcifications.  Coronary Arteries:  Normal coronary origin.  Right dominance.  RCA is a co- dominant artery that gives rise to PDA and PLVB. There is no plaque.  Left main is a large artery that gives rise to LAD, Ramus and LCX arteries. There is no plaque.  LAD is a large vessel that gives rise to 3 very small diagonals. There is no plaque.  Ramus is a large vessel that bifurcates distally. There is no plaque.  LCX is a co-dominant artery that gives rise to one large OM1 branch and a moderate sized OM2 branch. There is no plaque.  Other findings:  Normal pulmonary vein drainage into the left atrium.  Normal let atrial appendage without a thrombus.  Normal size of the pulmonary artery.  IMPRESSION: 1.  Coronary calcium score of 0. This was 0 percentile for age and sex matched control.  2.  Normal coronary origin with co dominance.  3.  No evidence of CAD.  CAD RADS 0.  4. Study limited by noise artifact but no obvious soft or calcified plaque.  5.  Consider non atherosclerotic causes of chest pain.  Traci Turner Assessment & Plan   1.  Chest pain-no chest pain today.  Family history of coronary artery disease mother had CAD in 9s.  Underwent coronary CTA 11/01/2020 which showed a calcium score of 0 and a 0% for age and sex mass control.  No evidence of CAD.  Details above.  Consider alternative causes for chest discomfort. Continue risk modification Heart healthy low-sodium diet Increase physical activity as tolerated  Essential hypertension-BP today***.  Well-controlled at home Continue HCTZ, losartan, metoprolol Heart healthy low-sodium diet-salty 6 given Increase physical activity as tolerated  Disposition: Follow-up with Dr. Wyline Mood in 1 year.  Thomasene Ripple. Jules Vidovich NP-C    11/27/2020, 10:58 AM [facility] [address] Office [phone] Fax [phone]  Notice: This dictation was prepared with Dragon dictation along with smaller phrase technology. Any transcriptional errors that result from this process are unintentional and may not be corrected upon review.

## 2020-11-28 ENCOUNTER — Telehealth: Payer: Managed Care, Other (non HMO) | Admitting: General Practice

## 2021-01-03 ENCOUNTER — Ambulatory Visit (HOSPITAL_COMMUNITY): Admission: RE | Admit: 2021-01-03 | Payer: Managed Care, Other (non HMO) | Source: Ambulatory Visit

## 2021-03-13 ENCOUNTER — Telehealth: Payer: Self-pay | Admitting: *Deleted

## 2021-03-13 DIAGNOSIS — Z006 Encounter for examination for normal comparison and control in clinical research program: Secondary | ICD-10-CM

## 2021-03-13 NOTE — Telephone Encounter (Signed)
I called patient for 90 day Identify study phone call. Patient stated that he still has pain in lower part of chest with occasional arm and shoulder pain. The pain is not has severe as it was prior to Cardiac CT in Nov. I reminded patient I would call him in in Nov for 1 year phone call.

## 2021-03-31 ENCOUNTER — Encounter (HOSPITAL_COMMUNITY): Payer: Self-pay | Admitting: *Deleted

## 2021-03-31 ENCOUNTER — Emergency Department (HOSPITAL_COMMUNITY)
Admission: EM | Admit: 2021-03-31 | Discharge: 2021-03-31 | Disposition: A | Discharge status: Home / Self Care | Payer: Managed Care, Other (non HMO) | Attending: Emergency Medicine | Admitting: Emergency Medicine

## 2021-03-31 ENCOUNTER — Emergency Department (HOSPITAL_COMMUNITY): Payer: Managed Care, Other (non HMO)

## 2021-03-31 ENCOUNTER — Other Ambulatory Visit: Payer: Self-pay

## 2021-03-31 ENCOUNTER — Telehealth: Payer: Self-pay | Admitting: Cardiology

## 2021-03-31 DIAGNOSIS — R231 Pallor: Secondary | ICD-10-CM | POA: Diagnosis not present

## 2021-03-31 DIAGNOSIS — Z79899 Other long term (current) drug therapy: Secondary | ICD-10-CM | POA: Diagnosis not present

## 2021-03-31 DIAGNOSIS — I10 Essential (primary) hypertension: Secondary | ICD-10-CM | POA: Insufficient documentation

## 2021-03-31 DIAGNOSIS — Z87891 Personal history of nicotine dependence: Secondary | ICD-10-CM | POA: Insufficient documentation

## 2021-03-31 DIAGNOSIS — R0789 Other chest pain: Secondary | ICD-10-CM | POA: Diagnosis present

## 2021-03-31 LAB — COMPREHENSIVE METABOLIC PANEL
ALT: 45 U/L — ABNORMAL HIGH (ref 0–44)
AST: 46 U/L — ABNORMAL HIGH (ref 15–41)
Albumin: 4 g/dL (ref 3.5–5.0)
Alkaline Phosphatase: 63 U/L (ref 38–126)
Anion gap: 8 (ref 5–15)
BUN: 16 mg/dL (ref 6–20)
CO2: 27 mmol/L (ref 22–32)
Calcium: 9.2 mg/dL (ref 8.9–10.3)
Chloride: 105 mmol/L (ref 98–111)
Creatinine, Ser: 0.94 mg/dL (ref 0.61–1.24)
GFR, Estimated: >60 mL/min (ref >60)
Glucose, Bld: 120 mg/dL — ABNORMAL HIGH (ref 70–99)
Potassium: 4.1 mmol/L (ref 3.5–5.1)
Sodium: 140 mmol/L (ref 135–145)
Total Bilirubin: 0.7 mg/dL (ref 0.3–1.2)
Total Protein: 7.5 g/dL (ref 6.5–8.1)

## 2021-03-31 LAB — LIPASE, BLOOD: Lipase: 32 U/L (ref 11–51)

## 2021-03-31 LAB — CBC WITH DIFFERENTIAL/PLATELET
Abs Immature Granulocytes: 0.02 10*3/uL (ref 0.00–0.07)
Basophils Absolute: 0 10*3/uL (ref 0.0–0.1)
Basophils Relative: 1 %
Eosinophils Absolute: 0.1 10*3/uL (ref 0.0–0.5)
Eosinophils Relative: 2 %
HCT: 45.9 % (ref 39.0–52.0)
Hemoglobin: 15.5 g/dL (ref 13.0–17.0)
Immature Granulocytes: 0 %
Lymphocytes Relative: 37 %
Lymphs Abs: 2.1 10*3/uL (ref 0.7–4.0)
MCH: 29.6 pg (ref 26.0–34.0)
MCHC: 33.8 g/dL (ref 30.0–36.0)
MCV: 87.8 fL (ref 80.0–100.0)
Monocytes Absolute: 0.6 10*3/uL (ref 0.1–1.0)
Monocytes Relative: 11 %
Neutro Abs: 2.8 10*3/uL (ref 1.7–7.7)
Neutrophils Relative %: 49 %
Platelets: 209 10*3/uL (ref 150–400)
RBC: 5.23 MIL/uL (ref 4.22–5.81)
RDW: 14.4 % (ref 11.5–15.5)
WBC: 5.7 10*3/uL (ref 4.0–10.5)
nRBC: 0 % (ref 0.0–0.2)

## 2021-03-31 LAB — TROPONIN I (HIGH SENSITIVITY)
Troponin I (High Sensitivity): 3 ng/L (ref <18)
Troponin I (High Sensitivity): 3 ng/L (ref <18)

## 2021-03-31 MED ORDER — ASPIRIN 81 MG PO CHEW
324.0000 mg | CHEWABLE_TABLET | Freq: Once | ORAL | Status: AC
  Administered 2021-03-31: 324 mg via ORAL
  Filled 2021-03-31: qty 4

## 2021-03-31 MED ORDER — ALUM & MAG HYDROXIDE-SIMETH 200-200-20 MG/5ML PO SUSP
30.0000 mL | Freq: Once | ORAL | Status: AC
  Administered 2021-03-31: 30 mL via ORAL
  Filled 2021-03-31: qty 30

## 2021-03-31 MED ORDER — PANTOPRAZOLE SODIUM 20 MG PO TBEC: 20.0000 mg | DELAYED_RELEASE_TABLET | Freq: Every day | ORAL | 0 refills | Status: DC

## 2021-03-31 MED ORDER — PANTOPRAZOLE SODIUM 40 MG PO TBEC
40.0000 mg | DELAYED_RELEASE_TABLET | Freq: Once | ORAL | Status: AC
  Administered 2021-03-31: 40 mg via ORAL
  Filled 2021-03-31: qty 1

## 2021-03-31 MED ORDER — LIDOCAINE VISCOUS HCL 2 % MT SOLN
15.0000 mL | Freq: Once | OROMUCOSAL | Status: AC
  Administered 2021-03-31: 15 mL via ORAL
  Filled 2021-03-31: qty 15

## 2021-03-31 NOTE — Telephone Encounter (Signed)
Had a coronary CTA just 5 months ago that was completley normal. His coronary calcium score was 0 indicating not a trace of any plaque or blockages. Would be very unlikely for significant change in just 5 months. Can keep appt with Mardelle Matte but needs to see pcp as well for other causes given extensive cardiac testing has been negative   J Cheston Coury MD

## 2021-03-31 NOTE — Discharge Instructions (Signed)
You were given a prescription for protonix as this may help with your chest pain. Your workup today was reassuring and did not show any evidence of a heart attack, pneumonia, collapsed lung or other heart/lung emergency.   Please follow up with your primary care provider within 5-7 days for re-evaluation of your symptoms. If you do not have a primary care provider, information for a healthcare clinic has been provided for you to make arrangements for follow up care. Please return to the emergency department for any new or worsening symptoms.

## 2021-03-31 NOTE — Telephone Encounter (Signed)
Pt rating chest pain right now 4/10 starting on last Thursday off and on lasting 30 mins at a time with fatigue  BP 130/78 HR 60 - denies SOB/dizziness/swelling - is taking losartan 100 mg daily - denies any med changes - scheduled pt with Nena Polio, NP 4/14 (couldn't come due to work schedule on first available 4/12) will forward to provider FYI - pt aware that with chest pain should have ED/urgent care visit

## 2021-03-31 NOTE — ED Triage Notes (Signed)
Chest pain onset a year ago

## 2021-03-31 NOTE — ED Provider Notes (Signed)
[facility] EMERGENCY DEPARTMENT Provider Note   CSN: [id number] Arrival date & time: 03/31/21  1122     History Chief Complaint  Patient presents with  . Chest Pain    Andrew Fields is a 45 y.o. male.  HPI   45 year old male with a history of hypertension, who presents to the emergency department today for evaluation of chest pain.  States he has had ongoing intermittent chest pain for the last year at least.  He has seen his PCP about it multiple times and has been placed on diclofenac.  Up until about a week ago this was helping his symptoms.  He reports the pain is located to the left side of his chest.  It radiates to the left shoulder and down the arm intermittently and feels like a dull pain.  Currently pain is rated 4/10.  Pain seems to be improved upon waking in the morning and worse throughout the day.  Resting/nothing seems to help the pain.  He denies of the pain is specifically exertional and states sometimes it occurs at rest.  It is not specifically associated with eating either.  He reports some intermittent associated clamminess but denies any associated shortness of breath, nausea, vomiting.  He denies any abdominal pain.  Denies any frequent EtOH use.  He denies any history of tobacco use.  He does report early family history of MI in his mother who is 14 when she passed.  States he called his PCP prior to arrival to be seen but was not able to secure an appointment and was sent here to the ED for evaluation.  Reports he has followed up with Dr. Wyline Mood with cardiology and has had a stress test and had a coronary CTA which showed a calcium score of 0 on 11/01/2020. He has an appointment with cardiology next week.   Past Medical History:  Diagnosis Date  . Hypertension     Patient Active Problem List   Diagnosis Date Noted  . Chest pain with moderate risk of acute coronary syndrome 10/22/2020  . Family history of early CAD 10/22/2020  . Essential hypertension  10/22/2020    Past Surgical History:  Procedure Laterality Date  . KNEE ARTHROSCOPY W/ MENISCAL REPAIR Right 06/2019       Family History  Problem Relation Age of Onset  . Hypertension Mother   . Hypertension Father     Social History   Tobacco Use  . Smoking status: Former Games developer  . Smokeless tobacco: Never Used  Vaping Use  . Vaping Use: Former  . Substances: Nicotine-salt  Substance Use Topics  . Alcohol use: Yes    Comment: occ  . Drug use: Never    Home Medications Prior to Admission medications   Medication Sig Start Date End Date Taking? Authorizing Provider  pantoprazole (PROTONIX) 20 MG tablet Take 1 tablet (20 mg total) by mouth daily for 14 days. 03/31/21 04/14/21 Yes Verenice Westrich S, PA-C  diclofenac (VOLTAREN) 75 MG EC tablet Take 1 tablet (75 mg total) by mouth 2 (two) times daily. Take with food Patient not taking: Reported on 10/22/2020 09/02/20   Triplett, Tammy, PA-C  famotidine (PEPCID) 20 MG tablet Take 20 mg by mouth daily. 10/03/20   [provider]  hydrochlorothiazide (HYDRODIURIL) 25 MG tablet Take 1 tablet (25 mg total) by mouth daily. 09/02/20   Triplett, Tammy, PA-C  losartan (COZAAR) 100 MG tablet Take 100 mg by mouth daily. 10/03/20   [provider]  metoprolol tartrate (  LOPRESSOR) 50 MG tablet Take 50 mg 2 hours before procedure 10/22/20   Corine Shelter K, PA-C  lisinopril (PRINIVIL,ZESTRIL) 10 MG tablet Take 1 tablet (10 mg total) by mouth daily. Patient not taking: Reported on 09/02/2020 11/15/18 09/02/20  Vanetta Mulders, MD    Allergies    Patient has no known allergies.  Review of Systems   Review of Systems  Constitutional: Negative for chills and fever.  HENT: Negative for ear pain and sore throat.   Eyes: Negative for visual disturbance.  Respiratory: Negative for cough and shortness of breath.   Cardiovascular: Positive for chest pain. Negative for leg swelling.  Gastrointestinal: Negative for abdominal pain,  constipation, diarrhea, nausea and vomiting.  Genitourinary: Negative for dysuria and hematuria.  Musculoskeletal: Negative for back pain.  Skin: Negative for rash.  Neurological: Negative for seizures and syncope.  All other systems reviewed and are negative.   Physical Exam Updated Vital Signs BP 126/88   Pulse 76   Temp 98.2 F (36.8 C) (Oral)   Resp 10   Ht 6\' 5"  (1.956 m)   Wt 136.1 kg   SpO2 98%   BMI 35.57 kg/m   Physical Exam Vitals and nursing note reviewed.  Constitutional:      Appearance: He is well-developed.  HENT:     Head: Normocephalic and atraumatic.  Eyes:     Conjunctiva/sclera: Conjunctivae normal.  Cardiovascular:     Rate and Rhythm: Normal rate and regular rhythm.     Heart sounds: Normal heart sounds. No murmur heard.   Pulmonary:     Effort: Pulmonary effort is normal. No respiratory distress.     Breath sounds: Normal breath sounds. No decreased breath sounds, wheezing, rhonchi or rales.  Chest:     Chest wall: No tenderness or crepitus.  Abdominal:     Palpations: Abdomen is soft.     Tenderness: There is no abdominal tenderness.  Musculoskeletal:     Cervical back: Neck supple.     Right lower leg: No tenderness. No edema.     Left lower leg: No tenderness. No edema.  Skin:    General: Skin is warm and dry.  Neurological:     Mental Status: He is alert.     ED Results / Procedures / Treatments   Labs (all labs ordered are listed, but only abnormal results are displayed) Labs Reviewed  COMPREHENSIVE METABOLIC PANEL - Abnormal; Notable for the following components:      Result Value   Glucose, Bld 120 (*)    AST 46 (*)    ALT 45 (*)    All other components within normal limits  LIPASE, BLOOD  CBC WITH DIFFERENTIAL/PLATELET  TROPONIN I (HIGH SENSITIVITY)  TROPONIN I (HIGH SENSITIVITY)    EKG None  Radiology DG Chest 1 View  Result Date: 03/31/2021 CLINICAL DATA:  Chest pain EXAM: CHEST  1 VIEW COMPARISON:  CT heart  November 01, 2020 and chest radiograph September 16, 2020 FINDINGS: The heart size and mediastinal contours are within normal limits. No focal consolidation or overt pulmonary edema. No significant pleural effusion or visible pneumothorax. The visualized skeletal structures are unremarkable. IMPRESSION: No evidence of active disease in the chest. Pulmonary nodules visualized on prior chest CT are not appreciated by chest radiograph. Please see CT report November 01, 2020 describing recommendations for follow-up. Electronically Signed   By: November 03, 2020 MD   On: 03/31/2021 13:08    Procedures Procedures   Medications Ordered in ED Medications  aspirin chewable tablet 324 mg (324 mg Oral Given 03/31/21 1245)  pantoprazole (PROTONIX) EC tablet 40 mg (40 mg Oral Given 03/31/21 1246)  alum & mag hydroxide-simeth (MAALOX/MYLANTA) 200-200-20 MG/5ML suspension 30 mL (30 mLs Oral Given 03/31/21 1246)    And  lidocaine (XYLOCAINE) 2 % viscous mouth solution 15 mL (15 mLs Oral Given 03/31/21 1246)    ED Course  I have reviewed the triage vital signs and the nursing notes.  Pertinent labs & imaging results that were available during my care of the patient were reviewed by me and considered in my medical decision making (see chart for details).    MDM Rules/Calculators/A&P                          45 y/o M with chest pain.   Reviewed/interpreted labs  CBC unremarkable CMP unremarkable Lipase neg Trop neg x2  EKG - with NSR, no arrhythmia or ischemic changes   Reviewed/interpreted imaging CXR - No evidence of active disease in the chest. Pulmonary nodules visualized on prior chest CT are not appreciated by chest radiograph. Please see CT report November 01, 2020 describing recommendations for follow-up  13 y/ M with CP. Has had negative w/u with cardiology in the past including coronary CTA completed 6 months ago which showed a calcium score of 0. I have very low suspicion for ACS. He is PERC negative  and I doubt PE. Doubt dissection, CT chest 03/2020 showed normal aorta. Suspect noncardiac and nonpulmonary cause of CP. He does have h/o GERD, question whether sxs could be related this. Will add protonix to his regimen. Advised pcp f/u and strict return precautions. He voices understanding of the plan and reasons to return. All questions answered, pt stable for discharge.   Final Clinical Impression(s) / ED Diagnoses Final diagnoses:  Atypical chest pain    Rx / DC Orders ED Discharge Orders         Ordered    pantoprazole (PROTONIX) 20 MG tablet  Daily        03/31/21 [address], [place], PA-C 03/31/21 1610    Bethann Berkshire, MD 04/02/21 [phone]

## 2021-03-31 NOTE — Telephone Encounter (Signed)
Pt c/o of Chest Pain:  1. Are you having CP right now? Off & On since last Thursday   2. Are you experiencing any other symptoms (ex. SOB, nausea, vomiting, sweating)? NO   3. How long have you been experiencing CP?  States that this weekend was very fatigue Was doing some work around the house on Saturday and started having some CP -decided to lay down.    4. Is your CP continuous or coming and going?  Both- left side of chest and left arm.and shoulder   5. Have you taken Nitroglycerin? No

## 2021-03-31 NOTE — ED Triage Notes (Signed)
States pain got worse over the last 5 days

## 2021-04-09 NOTE — Progress Notes (Signed)
Cardiology Office Note  Date: 04/10/2021   ID: Andrew Fields, DOB [birth year], MRN [id number]  PCP:  [facility]  Cardiologist:  Dina Rich, MD Electrophysiologist:  None   Chief Complaint: Chest pain  History of Present Illness: Andrew Fields is a 45 y.o. male with a history of HTN, chest pain, family history of coronary artery disease, anxiety.  Last seen by Corine Shelter 10/22/2020.  He was evaluated the prior year for chest pain with treadmill stress test which was low risk.  Had been seen in several different emergency room for chest pain.  He did have some atypical and typical features.  Described intermittent stabbing pain with pressure and left arm pain associated with shortness of breath.  He stated his symptoms have been increasing in the prior couple of weeks.  He was having some symptoms at work.  Coronary CT was arranged.  His blood pressure was controlled.  Coronary CT was negative. CAC score was 0  Recent presentation to [facility] emergency department on 03/31/2021 for evaluation of chest pain.  He has been having ongoing intermittent chest pain for the last year.  Had seen his PCP regarding chest pain multiple times and had been placed on diclofenac.  He stated up until a week prior it was helping his symptoms.  He described the pain as located on the left side of his chest.  It radiated to the left shoulder down the arm intermittently and felt like a dull pain.  Rated the pain at 4/10.  His CBC was unremarkable.  His CMP was unremarkable.  Lipase was negative, troponin was negative x2.  EKG showed normal sinus rhythm with no arrhythmia or ischemic changes.  Chest x-ray showed no active disease in the chest.  Protonix was added to his regimen.  He is here for follow-up today.  He states he cannot understand why he continues to have left-sided chest pain.  He states he was recently given Xanax by his PCP and 1 sublingual nitroglycerin pill to take in the  event that the chest pain recurs.  He is currently not on antihypertensive medications.  He had been on lisinopril in the past and it was replaced by losartan.  He states the losartan seemed to make his symptoms worse.  He states his blood pressure has been well controlled recently.  Currently the only medications he is taking are diclofenac and famotidine.  We went over the coronary CT scan he had previously in detail.  He is concerned due to strong family history in his mother of diabetes and heart disease that chest pain could be of cardiac origin.  He admits to some problems with gastroesophageal reflux disease.  He denies any association of pain related to eating.  He states he was treated in the past for possible peptic ulcer disease with antibiotics which did not seem to help.  He denies any dysphagia, nausea, vomiting.  He states his blood pressures well controlled at home.  Today BP slightly elevated at 128/90.  He denies any current anginal or exertional symptoms, palpitations or arrhythmias, orthostatic symptoms, CVA or TIA-like symptoms, bleeding in stool or urine.  No dark tarry stools.  No DVT or PE-like symptoms.   Past Medical History:  Diagnosis Date  . Hypertension     Past Surgical History:  Procedure Laterality Date  . KNEE ARTHROSCOPY W/ MENISCAL REPAIR Right 06/2019    Current Outpatient Medications  Medication Sig Dispense Refill  . diclofenac (VOLTAREN) 75 MG  EC tablet Take 75 mg by mouth as needed.    . famotidine (PEPCID) 20 MG tablet Take 20 mg by mouth daily.     No current facility-administered medications for this visit.   Allergies:  Patient has no known allergies.   Social History: The patient  reports that he has quit smoking. His smokeless tobacco use includes chew. He reports current alcohol use. He reports that he does not use drugs.   Family History: The patient's family history includes Hypertension in his father and mother.   ROS:  Please see the  history of present illness. Otherwise, complete review of systems is positive for none.  All other systems are reviewed and negative.   Physical Exam: VS:  Ht 6\' 5"  (1.956 m)   Wt (!) 307 lb 3.2 oz (139.3 kg)   BMI 36.43 kg/m , BMI Body mass index is 36.43 kg/m.  Wt Readings from Last 3 Encounters:  04/10/21 (!) 307 lb 3.2 oz (139.3 kg)  03/31/21 300 lb (136.1 kg)  10/22/20 (!) 303 lb (137.4 kg)    General: Patient appears comfortable at rest. Neck: Supple, no elevated JVP or carotid bruits, no thyromegaly. Lungs: Clear to auscultation, nonlabored breathing at rest. Cardiac: Regular rate and rhythm, no S3 or significant systolic murmur, no pericardial rub. Extremities: No pitting edema, distal pulses 2+. Skin: Warm and dry. Musculoskeletal: No kyphosis. Neuropsychiatric: Alert and oriented x3, affect grossly appropriate.  ECG:  Recent EKG 03/31/2021 at [facility], ED sinus rhythm with a rate of 76.  Recent Labwork: 03/31/2021: ALT 45; AST 46; BUN 16; Creatinine, Ser 0.94; Hemoglobin 15.5; Platelets 209; Potassium 4.1; Sodium 140  No results found for: CHOL, TRIG, HDL, CHOLHDL, VLDL, LDLCALC, LDLDIRECT  Other Studies Reviewed Today:  Coronary CTA 11/01/2020 IMPRESSION: 1. Coronary calcium score of 0. This was 0 percentile for age and sex matched control.  2.  Normal coronary origin with co dominance.  3.  No evidence of CAD.  CAD RADS 0.  4. Study limited by noise artifact but no obvious soft or calcified plaque.  5.  Consider non atherosclerotic causes of chest pain.  Assessment and Plan:  1. Other chest pain   2. Essential hypertension    1. Other chest pain History of recurrent chest pain without evidence of cardiac disease.  Recent coronary CTA November 2021 was negative for coronary artery disease.  Had a recent ED presentation on 03/31/2021 for similar chest pain symptoms.  He was ruled out for ACS.  He has a history of GERD and has been treated in the past for  PUD.  Please refer to [facility] for possible GI etiology of chest pain.  2. Essential hypertension Currently on no antihypertensive medications.  Blood pressure slightly elevated today at 128/90.  Patient states he had been on lisinopril in the past and was switched to losartan.  He states the losartan seem to exacerbate his chest pain symptoms.  He states PCP stopped all of his antihypertensive medications.  He states blood pressure is well controlled at home on no antihypertensive medications.  Medication Adjustments/Labs and Tests Ordered: Current medicines are reviewed at length with the patient today.  Concerns regarding medicines are outlined above.   Disposition: Follow-up with Dr. [facility] or APP 6 months  Signed, Wyline Mood, NP 04/10/2021 3:09 PM    [facility] [address] [place], [place], [place] Phone: [phone]; Fax: [phone]

## 2021-04-10 ENCOUNTER — Other Ambulatory Visit: Payer: Self-pay

## 2021-04-10 ENCOUNTER — Ambulatory Visit: Payer: Managed Care, Other (non HMO) | Admitting: Family Medicine

## 2021-04-10 ENCOUNTER — Encounter: Payer: Self-pay | Admitting: Family Medicine

## 2021-04-10 VITALS — BP 128/90 | HR 68 | Ht 77.0 in | Wt 307.2 lb

## 2021-04-10 DIAGNOSIS — I10 Essential (primary) hypertension: Secondary | ICD-10-CM | POA: Diagnosis not present

## 2021-04-10 DIAGNOSIS — R0789 Other chest pain: Secondary | ICD-10-CM | POA: Diagnosis not present

## 2021-04-10 NOTE — Patient Instructions (Signed)
Medication Instructions:  Continue all current medications.   Labwork: none  Testing/Procedures: none  Follow-Up: 6 months   Any Other Special Instructions Will Be Listed Below (If Applicable). You have been referred to GI    If you need a refill on your cardiac medications before your next appointment, please call your pharmacy.  

## 2021-04-14 ENCOUNTER — Encounter (INDEPENDENT_AMBULATORY_CARE_PROVIDER_SITE_OTHER): Payer: Self-pay | Admitting: *Deleted

## 2021-07-03 ENCOUNTER — Other Ambulatory Visit (INDEPENDENT_AMBULATORY_CARE_PROVIDER_SITE_OTHER): Payer: Self-pay

## 2021-07-03 ENCOUNTER — Encounter (INDEPENDENT_AMBULATORY_CARE_PROVIDER_SITE_OTHER): Payer: Self-pay | Admitting: Gastroenterology

## 2021-07-03 ENCOUNTER — Telehealth (INDEPENDENT_AMBULATORY_CARE_PROVIDER_SITE_OTHER): Payer: Self-pay

## 2021-07-03 ENCOUNTER — Other Ambulatory Visit: Payer: Self-pay

## 2021-07-03 ENCOUNTER — Ambulatory Visit (INDEPENDENT_AMBULATORY_CARE_PROVIDER_SITE_OTHER): Payer: Managed Care, Other (non HMO) | Admitting: Gastroenterology

## 2021-07-03 ENCOUNTER — Encounter (INDEPENDENT_AMBULATORY_CARE_PROVIDER_SITE_OTHER): Payer: Self-pay

## 2021-07-03 VITALS — BP 136/89 | HR 75 | Temp 98.5°F | Ht 77.0 in | Wt 303.7 lb

## 2021-07-03 DIAGNOSIS — R0789 Other chest pain: Secondary | ICD-10-CM | POA: Diagnosis not present

## 2021-07-03 DIAGNOSIS — K219 Gastro-esophageal reflux disease without esophagitis: Secondary | ICD-10-CM | POA: Diagnosis not present

## 2021-07-03 DIAGNOSIS — R7989 Other specified abnormal findings of blood chemistry: Secondary | ICD-10-CM | POA: Diagnosis not present

## 2021-07-03 DIAGNOSIS — Z01812 Encounter for preprocedural laboratory examination: Secondary | ICD-10-CM

## 2021-07-03 MED ORDER — PEG 3350-KCL-NA BICARB-NACL 420 G PO SOLR: 4000.0000 mL | ORAL | 0 refills | Status: DC

## 2021-07-03 MED ORDER — SERTRALINE HCL 25 MG PO TABS: 25.0000 mg | ORAL_TABLET | Freq: Every day | ORAL | 3 refills | Status: DC

## 2021-07-03 NOTE — Telephone Encounter (Signed)
LeighAnn Fawne Hughley, CMA  

## 2021-07-03 NOTE — Patient Instructions (Addendum)
Schedule EGD and colonoscopy Start sertraline 25 mg qday Stop diclofenac Schedule liver US Perform blood workup

## 2021-07-03 NOTE — Progress Notes (Signed)
Katrinka Blazing, M.D. Gastroenterology & Hepatology [facility] [address] [place], [place] [address] Primary Care Physician: [facility] [address] [place] [address]  Referring MD: Pryor Curia. Vincenza Hews, NP  Chief Complaint:  Chest pain  History of Present Illness: Andrew Fields is a 45 y.o. male with Pmh HTN and previous anxiety, who presents for evaluation of chest pain.  The patient reports that for the last year he has presented intermittent episodes of chest pain, which is located in the left side of his chest and occasionally radiates to the left arm and lateral side of the left side of the chest. He states that the episodes of chest pain can last for hours or 4-5 days. It has caused significant pain and sometimes he has to leave work. In a month he can have a couple of episodes. He has tried diclofenac as needed when he has chest pain but denies any improvement (takes it once or twice a week). There is no clear trigger for the pain episodes.  The pain usually subsides when he rests.  He also reports that for the last year he has felt "some episodes of fluttering of the chest". He does not believe these were palpitation episodes.  Denies any dysphagia or heartburn episodes.  In fact, the patient has a history of GERD for at least 20 years. He was taking Prilosec 40 mg every day for the last 10 years. He was taking the Prilosec when he had the chest pain episodes. Stopped taking it 6 months ago as he read Prilosec caused some anxiety and possible chest pain. Since then he was switched to famotidine 20 mg every day. He has felt during the evenings the effect wears off.   Given the frequency of his symptoms, he has been concerned frequently about the pain.  He believes that this has affected his social life as he is constantly thinking about the chest pain and if "something may be missing and his disease is more  severe.".  The patient has been evaluated by the cardiology clinic in the past.  He was given a prescription of Xanax by his PCP. Has only taken one pill but did not help for his pain. Denies having any significant stress recently. He used to have episodes of palpitations in the pass and was told it was related to anxiety but these episodes have not been present for the last year.  The patient was prescribed diclofenac by his frisbee as well for management of the pain.  He does not think the pain gets better with this medication, takes it 2 or 3 times a day without any relief.  He underwent a coronary CT scan which showed calcium score of 0 and no presence of any intrathoracic alterations I will explain his chest pain.  Has had multiple evaluations in the ER for chest pain and troponins have been negative.  Has also had previous EKGs which showed normal sinus rhythm without any alterations.  He was cleared from the cardiology standpoint regarding his chest pain.  In fact he was referred to our clinic by the cardiology clinic.  Notably, the patient had presence of elevation of his liver function tests since March 04, 2020 with AST of 74 and ALT of 58 (previously were within normal limits in 2020), most recent blood testing from 03/31/2021 showed persistent mild elevation of the aminotransferases of 46 and 45 the ALT, with normal total bilirubin of 0.4  and alkaline phosphatase of 65.  The patient denies having any nausea, vomiting, fever, chills, hematochezia, melena, hematemesis, abdominal distention, abdominal pain, diarrhea, jaundice, pruritus or weight loss.  Last [id number] Last Colonoscopy:never  FHx: neg for any gastrointestinal/liver disease,grandfather colon cancer in his mid 31s Social: quit smoking cigarrettes 20 years ago, chews tobacco, denies frequent alcohol or illicit drug use  Past Medical History: Past Medical History:  Diagnosis Date   Hypertension     Past Surgical  History: Past Surgical History:  Procedure Laterality Date   KNEE ARTHROSCOPY W/ MENISCAL REPAIR Right 06/2019    Family History: Family History  Problem Relation Age of Onset   Hypertension Mother    Hypertension Father     Social History: Social History   Tobacco Use  Smoking Status Former   Pack years: 0.00  Smokeless Tobacco Current   Types: Chew   Social History   Substance and Sexual Activity  Alcohol Use Yes   Comment: occ   Social History   Substance and Sexual Activity  Drug Use Never    Allergies: No Known Allergies  Medications: Current Outpatient Medications  Medication Sig Dispense Refill   amLODipine (NORVASC) 5 MG tablet Take 5 mg by mouth daily.     diclofenac (VOLTAREN) 75 MG EC tablet Take 75 mg by mouth as needed.     famotidine (PEPCID) 20 MG tablet Take 20 mg by mouth daily.     hydrochlorothiazide (HYDRODIURIL) 25 MG tablet Take 25 mg by mouth daily.     No current facility-administered medications for this visit.    Review of Systems: GENERAL: negative for malaise, night sweats HEENT: No changes in hearing or vision, no nose bleeds or other nasal problems. NECK: Negative for lumps, goiter, pain and significant neck swelling RESPIRATORY: Negative for cough, wheezing CARDIOVASCULAR: Negative for chest pain, leg swelling, palpitations, orthopnea GI: SEE HPI MUSCULOSKELETAL: Negative for joint pain or swelling, back pain, and muscle pain. SKIN: Negative for lesions, rash PSYCH: Negative for sleep disturbance, mood disorder and recent psychosocial stressors. HEMATOLOGY Negative for prolonged bleeding, bruising easily, and swollen nodes. ENDOCRINE: Negative for cold or heat intolerance, polyuria, polydipsia and goiter. NEURO: negative for tremor, gait imbalance, syncope and seizures. The remainder of the review of systems is noncontributory.   Physical Exam: BP 136/89 (BP Location: Right Arm, Patient Position: Sitting, Cuff Size:  Large)   Pulse 75   Temp 98.5 F (36.9 C) (Oral)   Ht 6\' 5"  (1.956 m)   Wt (!) 303 lb 11.2 oz (137.8 kg)   BMI 36.01 kg/m  GENERAL: The patient is AO x3, in no acute distress. HEENT: Head is normocephalic and atraumatic. EOMI are intact. Mouth is well hydrated and without lesions. NECK: Supple. No masses LUNGS: Clear to auscultation. No presence of rhonchi/wheezing/rales. Adequate chest expansion HEART: RRR, normal s1 and s2. ABDOMEN: Soft, nontender, no guarding, no peritoneal signs, and nondistended. BS +. No masses. EXTREMITIES: Without any cyanosis, clubbing, rash, lesions or edema. NEUROLOGIC: AOx3, no focal motor deficit. SKIN: no jaundice, no rashes   Imaging/Labs: as above  I personally reviewed and interpreted the available labs, imaging and endoscopic files.  Impression and Plan: Andrew Fields is a 45 y.o. male with Pmh HTN and previous anxiety, who presents for evaluation of chest pain.  The patient has presented recurrent episodes of chest pain which have a typical radiation to the left arm but do not have the typical behavior in terms of triggering activities and the duration of  the symptoms.  He has had an extensive cardiac investigation in the past which has been negative for any ischemic causes.  I had an extensive discussion with the patient for close to 40 minutes in which I explained that the next step will be to perform an EGD to evaluate his chest pain further.  As he has not presented any improvement with intake of diclofenac and PPI/famotidine, I consider it is less likely that his pain is related to costochondritis or purely to GERD.  I explained to the patient that it was possible that his chest pain was related to a functional etiology.  I also explained to him that even though he does not have any anxiety at the moment, he has a history of this disorder and it could be correlated to his current presentation.  I explained to the patient that if his endoscopy was  negative for any alterations explain his symptoms, we will need to proceed with a pH impedance testing on famotidine and esophageal manometry.  The patient expressed his concern as the pain has been quite frequent, he would like to start other type of medication.  I will start him on low-dose sertraline which ideally should provide some relief of his symptoms after taking this for at least 6 weeks.  Their hand, the patient has presented elevated aminotransferases for the last year of unclear etiology.  The pattern shows a predominant elevation of the AST over the ALT.  This could be related to his use of diclofenac possibly.  He will need to stop taking this medication.  We will check for other causes with a liver ultrasound and blood work-up.  He does not drink alcohol frequently.  Finally, patient is due for colorectal cancer screening, will schedule for colonoscopy in the same day of his esophagogastroduodenospy.  The patient understood and agreed.  -Schedule EGD and colonoscopy -Start sertraline 25 mg qday -Stop diclofenac -Schedule liver US - Check CMP, ANA, ASMA, IgG, iron panel, alpha 1 antitrypsin level and acute hepatitis panel  All questions were answered.      Katrinka Blazing, MD Gastroenterology and Hepatology [facility]

## 2021-07-21 ENCOUNTER — Ambulatory Visit (HOSPITAL_COMMUNITY): Payer: Managed Care, Other (non HMO)

## 2021-07-28 NOTE — Patient Instructions (Signed)
Andrew Fields  07/28/2021     @PREFPERIOPPHARMACY @   Your procedure is scheduled on  08/05/2021.   Report to 10/05/2021 at  [phone]  A.M.   Call this number if you have problems the morning of surgery:  [phone]   Remember:  Follow the diet and prep instructions given to you by the office    Take these medicines the morning of surgery with A SIP OF WATER                            amlodipine, pepcid, zoloft.     Do not wear jewelry, make-up or nail polish.  Do not wear lotions, powders, or perfumes, or deodorant.  Do not shave 48 hours prior to surgery.  Men may shave face and neck.  Do not bring valuables to the hospital.  [facility] is not responsible for any belongings or valuables.  Contacts, dentures or bridgework may not be worn into surgery.  Leave your suitcase in the car.  After surgery it may be brought to your room.  For patients admitted to the hospital, discharge time will be determined by your treatment team.  Patients discharged the day of surgery will not be allowed to drive home and must have someone with them for 24 hours.    Special instructions:    DO NOT smoke tobacco or vape for 24 hours before your procedure.  Please read over the following fact sheets that you were given. Anesthesia Post-op Instructions and Care and Recovery After Surgery         Upper Endoscopy, Adult, Care After This sheet gives you information about how to care for yourself after your procedure. Your health care provider may also give you more specific instructions. If you have problems or questions, contact your health careprovider. What can I expect after the procedure? After the procedure, it is common to have: A sore throat. Mild stomach pain or discomfort. Bloating. Nausea. Follow these instructions at home:  Follow instructions from your health care provider about what to eat or drink after your procedure. Return to your normal activities as told by  your health care provider. Ask your health care provider what activities are safe for you. Take over-the-counter and prescription medicines only as told by your health care provider. If you were given a sedative during the procedure, it can affect you for several hours. Do not drive or operate machinery until your health care provider says that it is safe. Keep all follow-up visits as told by your health care provider. This is important. Contact a health care provider if you have: A sore throat that lasts longer than one day. Trouble swallowing. Get help right away if: You vomit blood or your vomit looks like coffee grounds. You have: A fever. Bloody, black, or tarry stools. A severe sore throat or you cannot swallow. Difficulty breathing. Severe pain in your chest or abdomen. Summary After the procedure, it is common to have a sore throat, mild stomach discomfort, bloating, and nausea. If you were given a sedative during the procedure, it can affect you for several hours. Do not drive or operate machinery until your health care provider says that it is safe. Follow instructions from your health care provider about what to eat or drink after your procedure. Return to your normal activities as told by your health care provider. This information is not intended to replace advice  given to you by your health care provider. Make sure you discuss any questions you have with your healthcare provider. Document Revised: 12/12/2019 Document Reviewed: 05/16/2018 Elsevier Patient Education  2022 [employer] Colonoscopy, Adult, Care After This sheet gives you information about how to care for yourself after your procedure. Your health care provider may also give you more specific instructions. If you have problems or questions, contact your health careprovider. What can I expect after the procedure? After the procedure, it is common to have: A small amount of blood in your stool for 24 hours after  the procedure. Some gas. Mild cramping or bloating of your abdomen. Follow these instructions at home: Eating and drinking  Drink enough fluid to keep your urine pale yellow. Follow instructions from your health care provider about eating or drinking restrictions. Resume your normal diet as instructed by your health care provider. Avoid heavy or fried foods that are hard to digest.  Activity Rest as told by your health care provider. Avoid sitting for a long time without moving. Get up to take short walks every 1-2 hours. This is important to improve blood flow and breathing. Ask for help if you feel weak or unsteady. Return to your normal activities as told by your health care provider. Ask your health care provider what activities are safe for you. Managing cramping and bloating  Try walking around when you have cramps or feel bloated. Apply heat to your abdomen as told by your health care provider. Use the heat source that your health care provider recommends, such as a moist heat pack or a heating pad. Place a towel between your skin and the heat source. Leave the heat on for 20-30 minutes. Remove the heat if your skin turns bright red. This is especially important if you are unable to feel pain, heat, or cold. You may have a greater risk of getting burned.  General instructions If you were given a sedative during the procedure, it can affect you for several hours. Do not drive or operate machinery until your health care provider says that it is safe. For the first 24 hours after the procedure: Do not sign important documents. Do not drink alcohol. Do your regular daily activities at a slower pace than normal. Eat soft foods that are easy to digest. Take over-the-counter and prescription medicines only as told by your health care provider. Keep all follow-up visits as told by your health care provider. This is important. Contact a health care provider if: You have blood in your  stool 2-3 days after the procedure. Get help right away if you have: More than a small spotting of blood in your stool. Large blood clots in your stool. Swelling of your abdomen. Nausea or vomiting. A fever. Increasing pain in your abdomen that is not relieved with medicine. Summary After the procedure, it is common to have a small amount of blood in your stool. You may also have mild cramping and bloating of your abdomen. If you were given a sedative during the procedure, it can affect you for several hours. Do not drive or operate machinery until your health care provider says that it is safe. Get help right away if you have a lot of blood in your stool, nausea or vomiting, a fever, or increased pain in your abdomen. This information is not intended to replace advice given to you by your health care provider. Make sure you discuss any questions you have with your healthcare provider. Document Revised:  12/08/2019 Document Reviewed: 07/10/2019 Elsevier Patient Education  2022 Else[employer] Inc. Monitored Anesthesia Care, Care After This sheet gives you information about how to care for yourself after your procedure. Your health care provider may also give you more specific instructions. If you have problems or questions, contact your health careprovider. What can I expect after the procedure? After the procedure, it is common to have: Tiredness. Forgetfulness about what happened after the procedure. Impaired judgment for important decisions. Nausea or vomiting. Some difficulty with balance. Follow these instructions at home: For the time period you were told by your health care provider:     Rest as needed. Do not participate in activities where you could fall or become injured. Do not drive or use machinery. Do not drink alcohol. Do not take sleeping pills or medicines that cause drowsiness. Do not make important decisions or sign legal documents. Do not take care of children on your  own. Eating and drinking Follow the diet that is recommended by your health care provider. Drink enough fluid to keep your urine pale yellow. If you vomit: Drink water, juice, or soup when you can drink without vomiting. Make sure you have little or no nausea before eating solid foods. General instructions Have a responsible adult stay with you for the time you are told. It is important to have someone help care for you until you are awake and alert. Take over-the-counter and prescription medicines only as told by your health care provider. If you have sleep apnea, surgery and certain medicines can increase your risk for breathing problems. Follow instructions from your health care provider about wearing your sleep device: Anytime you are sleeping, including during daytime naps. While taking prescription pain medicines, sleeping medicines, or medicines that make you drowsy. Avoid smoking. Keep all follow-up visits as told by your health care provider. This is important. Contact a health care provider if: You keep feeling nauseous or you keep vomiting. You feel light-headed. You are still sleepy or having trouble with balance after 24 hours. You develop a rash. You have a fever. You have redness or swelling around the IV site. Get help right away if: You have trouble breathing. You have new-onset confusion at home. Summary For several hours after your procedure, you may feel tired. You may also be forgetful and have poor judgment. Have a responsible adult stay with you for the time you are told. It is important to have someone help care for you until you are awake and alert. Rest as told. Do not drive or operate machinery. Do not drink alcohol or take sleeping pills. Get help right away if you have trouble breathing, or if you suddenly become confused. This information is not intended to replace advice given to you by your health care provider. Make sure you discuss any questions you  have with your healthcare provider. Document Revised: 08/29/2020 Document Reviewed: 11/16/2019 Elsevier Patient Education  2022 [employer].

## 2021-07-31 ENCOUNTER — Encounter (HOSPITAL_COMMUNITY)
Admission: RE | Admit: 2021-07-31 | Discharge: 2021-07-31 | Disposition: A | Discharge status: Home / Self Care | Payer: Managed Care, Other (non HMO) | Source: Ambulatory Visit | Attending: Gastroenterology | Admitting: Gastroenterology

## 2021-07-31 ENCOUNTER — Encounter (HOSPITAL_COMMUNITY): Payer: Self-pay

## 2021-08-05 ENCOUNTER — Encounter (HOSPITAL_COMMUNITY): Admission: RE | Payer: Self-pay | Source: Home / Self Care

## 2021-08-05 ENCOUNTER — Ambulatory Visit (HOSPITAL_COMMUNITY)
Admission: RE | Admit: 2021-08-05 | Payer: Managed Care, Other (non HMO) | Source: Home / Self Care | Admitting: Gastroenterology

## 2021-08-05 SURGERY — COLONOSCOPY WITH PROPOFOL
Anesthesia: Monitor Anesthesia Care

## 2022-11-15 ENCOUNTER — Encounter (INDEPENDENT_AMBULATORY_CARE_PROVIDER_SITE_OTHER): Payer: Self-pay | Admitting: Gastroenterology

## 2023-03-08 NOTE — H&P (View-Only) (Signed)
  Referring Provider: [facility] Primary Care Physician:  [facility] Primary GI Physician: Dr. Castaneda  No chief complaint on file.   HPI:   Andrew Fields is a 46 y.o. male with history of HTN, GERD, elevated LFTs, presenting today for ***  Last seen in the office by Dr. Castaneda 07/03/2021 for evaluation of chest pain with negative cardiac evaluation.  Query functional component/anxiety versus GERD/costochondritis.  Recommended EGD.  He was also started on sertraline.  Also noted mildly elevated LFTs possibly secondary to diclofenac.  Recommended stopping diclofenac and checking for other causes.  Patient denies frequent alcohol use.  He is also advised to have routine colonoscopy.  Labs were never completed. RUQ ultrasound not completed. Patient no showed for EGD and colonoscopy.    Past Medical History:  Diagnosis Date   Hypertension     Past Surgical History:  Procedure Laterality Date   KNEE ARTHROSCOPY W/ MENISCAL REPAIR Right 06/2019    Current Outpatient Medications  Medication Sig Dispense Refill   amLODipine (NORVASC) 5 MG tablet Take 5 mg by mouth daily.     famotidine (PEPCID) 20 MG tablet Take 20 mg by mouth daily.     hydrochlorothiazide (HYDRODIURIL) 25 MG tablet Take 25 mg by mouth daily.     polyethylene glycol-electrolytes (TRILYTE) 420 g solution Take 4,000 mLs by mouth as directed. 4000 mL 0   sertraline (ZOLOFT) 25 MG tablet Take 1 tablet (25 mg total) by mouth daily. 90 tablet 3   No current facility-administered medications for this visit.    Allergies as of 03/10/2023   (No Known Allergies)    Family History  Problem Relation Age of Onset   Hypertension Mother    Hypertension Father     Social History   Socioeconomic History   Marital status: Married    Spouse name: Not on file   Number of children: Not on file   Years of education: Not on file   Highest education level: Not on file  Occupational  History   Not on file  Tobacco Use   Smoking status: Former   Smokeless tobacco: Current    Types: Chew  Vaping Use   Vaping Use: Former   Substances: Nicotine-salt  Substance and Sexual Activity   Alcohol use: Yes    Comment: occ   Drug use: Never   Sexual activity: Not on file  Other Topics Concern   Not on file  Social History Narrative   Not on file   Social Determinants of Health   Financial Resource Strain: Not on file  Food Insecurity: Not on file  Transportation Needs: Not on file  Physical Activity: Not on file  Stress: Not on file  Social Connections: Not on file    Review of Systems: Gen: Denies fever, chills, anorexia. Denies fatigue, weakness, weight loss.  CV: Denies chest pain, palpitations, syncope, peripheral edema, and claudication. Resp: Denies dyspnea at rest, cough, wheezing, coughing up blood, and pleurisy. GI: Denies vomiting blood, jaundice, and fecal incontinence.   Denies dysphagia or odynophagia. Derm: Denies rash, itching, dry skin Psych: Denies depression, anxiety, memory loss, confusion. No homicidal or suicidal ideation.  Heme: Denies bruising, bleeding, and enlarged lymph nodes.  Physical Exam: There were no vitals taken for this visit. General:   Alert and oriented. No distress noted. Pleasant and cooperative.  Head:  Normocephalic and atraumatic. Eyes:  Conjuctiva clear without scleral icterus. Heart:  S1, S2 present without murmurs appreciated. Lungs:    Clear to auscultation bilaterally. No wheezes, rales, or rhonchi. No distress.  Abdomen:  +BS, soft, non-tender and non-distended. No rebound or guarding. No HSM or masses noted. Msk:  Symmetrical without gross deformities. Normal posture. Extremities:  Without edema. Neurologic:  Alert and  oriented x4 Psych:  Normal mood and affect.    Assessment:     Plan:  ***   Pate Aylward, PA-C [facility] 03/10/2023  

## 2023-03-08 NOTE — Progress Notes (Unsigned)
Referring Provider: [facility] Primary Care Physician:  [facility] Primary GI Physician: Dr. Levon Hedger  No chief complaint on file.   HPI:   Andrew Fields is a 47 y.o. male with history of HTN, GERD, elevated LFTs, presenting today for ***  Last seen in the office by Dr. Levon Hedger 07/03/2021 for evaluation of chest pain with negative cardiac evaluation.  Query functional component/anxiety versus GERD/costochondritis.  Recommended EGD.  He was also started on sertraline.  Also noted mildly elevated LFTs possibly secondary to diclofenac.  Recommended stopping diclofenac and checking for other causes.  Patient denies frequent alcohol use.  He is also advised to have routine colonoscopy.  Labs were never completed. RUQ ultrasound not completed. Patient no showed for EGD and colonoscopy.    Past Medical History:  Diagnosis Date   Hypertension     Past Surgical History:  Procedure Laterality Date   KNEE ARTHROSCOPY W/ MENISCAL REPAIR Right 06/2019    Current Outpatient Medications  Medication Sig Dispense Refill   amLODipine (NORVASC) 5 MG tablet Take 5 mg by mouth daily.     famotidine (PEPCID) 20 MG tablet Take 20 mg by mouth daily.     hydrochlorothiazide (HYDRODIURIL) 25 MG tablet Take 25 mg by mouth daily.     polyethylene glycol-electrolytes (TRILYTE) 420 g solution Take 4,000 mLs by mouth as directed. 4000 mL 0   sertraline (ZOLOFT) 25 MG tablet Take 1 tablet (25 mg total) by mouth daily. 90 tablet 3   No current facility-administered medications for this visit.    Allergies as of 03/10/2023   (No Known Allergies)    Family History  Problem Relation Age of Onset   Hypertension Mother    Hypertension Father     Social History   Socioeconomic History   Marital status: Married    Spouse name: Not on file   Number of children: Not on file   Years of education: Not on file   Highest education level: Not on file  Occupational  History   Not on file  Tobacco Use   Smoking status: Former   Smokeless tobacco: Current    Types: Chew  Vaping Use   Vaping Use: Former   Substances: Nicotine-salt  Substance and Sexual Activity   Alcohol use: Yes    Comment: occ   Drug use: Never   Sexual activity: Not on file  Other Topics Concern   Not on file  Social History Narrative   Not on file   Social Determinants of Health   Financial Resource Strain: Not on file  Food Insecurity: Not on file  Transportation Needs: Not on file  Physical Activity: Not on file  Stress: Not on file  Social Connections: Not on file    Review of Systems: Gen: Denies fever, chills, anorexia. Denies fatigue, weakness, weight loss.  CV: Denies chest pain, palpitations, syncope, peripheral edema, and claudication. Resp: Denies dyspnea at rest, cough, wheezing, coughing up blood, and pleurisy. GI: Denies vomiting blood, jaundice, and fecal incontinence.   Denies dysphagia or odynophagia. Derm: Denies rash, itching, dry skin Psych: Denies depression, anxiety, memory loss, confusion. No homicidal or suicidal ideation.  Heme: Denies bruising, bleeding, and enlarged lymph nodes.  Physical Exam: There were no vitals taken for this visit. General:   Alert and oriented. No distress noted. Pleasant and cooperative.  Head:  Normocephalic and atraumatic. Eyes:  Conjuctiva clear without scleral icterus. Heart:  S1, S2 present without murmurs appreciated. Lungs:  Clear to auscultation bilaterally. No wheezes, rales, or rhonchi. No distress.  Abdomen:  +BS, soft, non-tender and non-distended. No rebound or guarding. No HSM or masses noted. Msk:  Symmetrical without gross deformities. Normal posture. Extremities:  Without edema. Neurologic:  Alert and  oriented x4 Psych:  Normal mood and affect.    Assessment:     Plan:  ***   Ermalinda Memos, PA-C [facility] 03/10/2023

## 2023-03-09 DIAGNOSIS — I10 Essential (primary) hypertension: Secondary | ICD-10-CM | POA: Diagnosis not present

## 2023-03-10 ENCOUNTER — Ambulatory Visit (INDEPENDENT_AMBULATORY_CARE_PROVIDER_SITE_OTHER): Payer: Self-pay | Admitting: Gastroenterology

## 2023-03-10 ENCOUNTER — Other Ambulatory Visit: Payer: Self-pay | Admitting: *Deleted

## 2023-03-10 ENCOUNTER — Encounter: Payer: Self-pay | Admitting: Gastroenterology

## 2023-03-10 ENCOUNTER — Encounter: Payer: Self-pay | Admitting: *Deleted

## 2023-03-10 VITALS — BP 121/85 | HR 88 | Temp 98.2°F | Ht 77.0 in | Wt 309.0 lb

## 2023-03-10 DIAGNOSIS — K219 Gastro-esophageal reflux disease without esophagitis: Secondary | ICD-10-CM

## 2023-03-10 DIAGNOSIS — R195 Other fecal abnormalities: Secondary | ICD-10-CM

## 2023-03-10 DIAGNOSIS — R079 Chest pain, unspecified: Secondary | ICD-10-CM

## 2023-03-10 DIAGNOSIS — K625 Hemorrhage of anus and rectum: Secondary | ICD-10-CM

## 2023-03-10 MED ORDER — PANTOPRAZOLE SODIUM 40 MG PO TBEC: 40.0000 mg | DELAYED_RELEASE_TABLET | Freq: Every day | ORAL | 3 refills | Status: DC

## 2023-03-10 MED ORDER — PEG 3350-KCL-NA BICARB-NACL 420 G PO SOLR: 4000.0000 mL | Freq: Once | ORAL | 0 refills | Status: AC

## 2023-03-10 NOTE — Patient Instructions (Signed)
We will arrange for you to have an upper endoscopy and colonoscopy in the near future with Dr. Jenetta Downer. If your bowels are not moving every day 1 week prior to your colonoscopy, start MiraLAX 17 g daily in 8 ounces of water.  For reflux: Stop Pepcid and start pantoprazole 40 mg daily 30 minutes before breakfast. Follow a GERD diet/lifestyle:  Avoid fried, fatty, greasy, spicy, citrus foods. Avoid caffeine and carbonated beverages. Avoid chocolate. Try eating 4-6 small meals a day rather than 3 large meals. Do not eat within 3 hours of laying down. Prop head of bed up on wood or bricks to create a 6 inch incline.   We will follow-up with you in the office after your procedures.  Do not hesitate to call sooner if you have questions or concerns.  It was very nice to meet you today!  Andrew Altes, PA-C [facility]

## 2023-03-14 ENCOUNTER — Other Ambulatory Visit: Payer: Self-pay

## 2023-03-14 ENCOUNTER — Encounter (HOSPITAL_COMMUNITY): Payer: Self-pay

## 2023-03-14 ENCOUNTER — Emergency Department (HOSPITAL_COMMUNITY): Payer: 59

## 2023-03-14 ENCOUNTER — Emergency Department (HOSPITAL_COMMUNITY)
Admission: EM | Admit: 2023-03-14 | Discharge: 2023-03-14 | Disposition: A | Discharge status: Home / Self Care | Payer: 59 | Attending: Emergency Medicine | Admitting: Emergency Medicine

## 2023-03-14 DIAGNOSIS — Z79899 Other long term (current) drug therapy: Secondary | ICD-10-CM | POA: Insufficient documentation

## 2023-03-14 DIAGNOSIS — R000 Tachycardia, unspecified: Secondary | ICD-10-CM | POA: Diagnosis not present

## 2023-03-14 DIAGNOSIS — R002 Palpitations: Secondary | ICD-10-CM | POA: Diagnosis not present

## 2023-03-14 DIAGNOSIS — I10 Essential (primary) hypertension: Secondary | ICD-10-CM | POA: Insufficient documentation

## 2023-03-14 LAB — BASIC METABOLIC PANEL
Anion gap: 7 (ref 5–15)
BUN: 26 mg/dL — ABNORMAL HIGH (ref 6–20)
CO2: 28 mmol/L (ref 22–32)
Calcium: 9.4 mg/dL (ref 8.9–10.3)
Chloride: 99 mmol/L (ref 98–111)
Creatinine, Ser: 1.11 mg/dL (ref 0.61–1.24)
GFR, Estimated: >60 mL/min (ref >60)
Glucose, Bld: 85 mg/dL (ref 70–99)
Potassium: 4.3 mmol/L (ref 3.5–5.1)
Sodium: 134 mmol/L — ABNORMAL LOW (ref 135–145)

## 2023-03-14 LAB — HEPATIC FUNCTION PANEL
ALT: 40 U/L (ref 0–44)
AST: 41 U/L (ref 15–41)
Albumin: 4.2 g/dL (ref 3.5–5.0)
Alkaline Phosphatase: 76 U/L (ref 38–126)
Bilirubin, Direct: 0.1 mg/dL (ref 0.0–0.2)
Indirect Bilirubin: 0.5 mg/dL (ref 0.3–0.9)
Total Bilirubin: 0.6 mg/dL (ref 0.3–1.2)
Total Protein: 7.9 g/dL (ref 6.5–8.1)

## 2023-03-14 LAB — CBC
HCT: 48.8 % (ref 39.0–52.0)
Hemoglobin: 16.7 g/dL (ref 13.0–17.0)
MCH: 29.6 pg (ref 26.0–34.0)
MCHC: 34.2 g/dL (ref 30.0–36.0)
MCV: 86.4 fL (ref 80.0–100.0)
Platelets: 218 10*3/uL (ref 150–400)
RBC: 5.65 MIL/uL (ref 4.22–5.81)
RDW: 14 % (ref 11.5–15.5)
WBC: 7.6 10*3/uL (ref 4.0–10.5)
nRBC: 0 % (ref 0.0–0.2)

## 2023-03-14 NOTE — Discharge Instructions (Signed)
Evaluation today was overall reassuring.  Your fast heart rate is likely due to dehydration.  Recommend that you hydrate appropriately at home with water and Gatorade.  Also recommend you follow-up with your PCP for your ongoing elevated heart rate.  If you develop chest pain, shortness of breath, calf tenderness, palpitations or any other concerning symptom please return emergency department further evaluation.

## 2023-03-14 NOTE — ED Provider Triage Note (Signed)
Emergency Medicine Provider Triage Evaluation Note  ATIBA MIMNAUGH , a 47 y.o. male  was evaluated in triage.  Pt complains of elevated heart rate since starting hctz.  Pt reports he has been on lisinopril and blood pressure remained high.    Review of Systems  Positive: Elevated heart rate Negative: Shortness of breath  Physical Exam  BP 121/88 (BP Location: Right Arm)   Pulse (!) 106   Temp 98.2 F (36.8 C) (Oral)   Resp 18   Ht 6\' 5"  (1.956 m)   Wt (!) 138.3 kg   SpO2 97%   BMI 36.17 kg/m  Gen:   Awake, no distress   Resp:  Normal effort  MSK:   Moves extremities without difficulty  Other:    Medical Decision Making  Medically screening exam initiated at 5:24 PM.  Appropriate orders placed.  SNYDER BOSSIER was informed that the remainder of the evaluation will be completed by another provider, this initial triage assessment does not replace that evaluation, and the importance of remaining in the ED until their evaluation is complete.     Fransico Meadow, [place] 03/14/23 1726

## 2023-03-14 NOTE — ED Triage Notes (Signed)
For the last 3 days he has been having palpitations and heart rate has been consistently over 100. He has been started on HCTZ recently.

## 2023-03-14 NOTE — ED Provider Notes (Signed)
[facility] Provider Note   CSN: [id number] Arrival date & time: 03/14/23  1549     History  Chief Complaint  Patient presents with   Palpitations   HPI Andrew Fields is a 47 y.o. male with hypertension presenting for fast heart rate.  States it started about 3 days ago.  States he recently added HCTZ for hypertension and is still taking his lisinopril.  States he has been urinating more frequently since he started the medication.  Also mention that he is working out and exercising more.  Does state that he drinks 2-3 bottles of water per day. Denies chest pain, shortness of breath and palpitations.  States he has noticed on his heart rate monitor at night that his heart rate can be anywhere from low 100s to 110. He spoke to some friends who encouraged him to be evaluated in the ED stating that the heart rate was too fast.  Also denies calf tenderness, recent immobilization, and recent surgery.   Palpitations      Home Medications Prior to Admission medications   Medication Sig Start Date End Date Taking? Authorizing Provider  ALPRAZolam Duanne Moron) 1 MG tablet Take 1 tablet by mouth daily as needed. 02/02/23   [provider]  amLODipine (NORVASC) 5 MG tablet Take 5 mg by mouth daily. Patient not taking: Reported on 03/10/2023    [provider]  hydrochlorothiazide (HYDRODIURIL) 25 MG tablet Take 25 mg by mouth daily.    [provider]  lisinopril (ZESTRIL) 20 MG tablet Take 20 mg by mouth daily.    [provider]  pantoprazole (PROTONIX) 40 MG tablet Take 1 tablet (40 mg total) by mouth daily before breakfast. 03/10/23   Erenest Rasher, PA-C      Allergies    Patient has no known allergies.    Review of Systems   Review of Systems  Cardiovascular:  Positive for palpitations.    Physical Exam Updated Vital Signs BP 111/81   Pulse 90   Temp 98.1 F (36.7 C) (Oral)   Resp 16   Ht 6\' 5"   (1.956 m)   Wt (!) 137.4 kg   SpO2 96%   BMI 35.93 kg/m  Physical Exam Vitals and nursing note reviewed.  HENT:     Head: Normocephalic and atraumatic.     Mouth/Throat:     Mouth: Mucous membranes are moist.  Eyes:     General:        Right eye: No discharge.        Left eye: No discharge.     Conjunctiva/sclera: Conjunctivae normal.  Cardiovascular:     Rate and Rhythm: Normal rate and regular rhythm.     Pulses: Normal pulses.     Heart sounds: Normal heart sounds.  Pulmonary:     Effort: Pulmonary effort is normal.     Breath sounds: Normal breath sounds.  Abdominal:     General: Abdomen is flat.     Palpations: Abdomen is soft.  Skin:    General: Skin is warm and dry.  Neurological:     General: No focal deficit present.  Psychiatric:        Mood and Affect: Mood normal.     ED Results / Procedures / Treatments   Labs (all labs ordered are listed, but only abnormal results are displayed) Labs Reviewed  BASIC METABOLIC PANEL - Abnormal; Notable for the following components:      Result  Value   Sodium 134 (*)    BUN 26 (*)    All other components within normal limits  CBC  HEPATIC FUNCTION PANEL    EKG EKG Interpretation  Date/Time:  Sunday March 14 2023 16:36:07 EDT Ventricular Rate:  106 PR Interval:  150 QRS Duration: 84 QT Interval:  326 QTC Calculation: 433 R Axis:   62 Text Interpretation: Sinus tachycardia Otherwise normal ECG When compared with ECG of 31-Mar-2021 11:40, PREVIOUS ECG IS PRESENT Confirmed by Noemi Chapel [phone]) on 03/14/2023 4:50:18 PM  Radiology DG Chest 2 View  Result Date: 03/14/2023 CLINICAL DATA:  Palpitations EXAM: CHEST - 2 VIEW COMPARISON:  02/14/2023 FINDINGS: Mild bronchitic changes. No acute airspace disease or effusion. Normal cardiac size. No pneumothorax. IMPRESSION: No active cardiopulmonary disease. Mild bronchitic changes. Electronically Signed   By: Donavan Foil M.D.   On: 03/14/2023 17:21     Procedures Procedures    Medications Ordered in ED Medications - No data to display  ED Course/ Medical Decision Making/ A&P Clinical Course as of 03/14/23 Malachi Paradise Mar 14, 2023  1741 Hemoglobin: 16.7 [JR]    Clinical Course User Index [JR] Harriet Pho, PA-C                             Medical Decision Making Amount and/or Complexity of Data Reviewed Labs: ordered. Decision-making details documented in ED Course. Radiology: ordered.   Initial Impression and Ddx 47 year old male who is well-appearing and hemoglobin stable presenting for fast heart rate.  Exam was unremarkable.  DDx includes arrhythmia, ACS, PE, and dehydration. Patient PMH that increases complexity of ED encounter: hypertension  Interpretation of Diagnostics - I independent reviewed and interpreted the labs as followed: Elevated BUN  - I independently visualized the following imaging with scope of interpretation limited to determining acute life threatening conditions related to emergency care: CXR, which revealed no acute cardiopulmonary process, mild bronchitic changes  -I personally reviewed and interpreted EKG which revealed sinus tachycardia  Patient Reassessment and Ultimate Disposition/Management Overall patient appeared clinically well without symptoms.  Patient very concerned about his heart rate in the setting of recently starting HCTZ.  Leading suspicion is likely that it is dehydration.  Especially given it sounds like HCTZ is working really well for him since he is urinating more frequently.  Patient is also exercising more frequently which likely means he has more insensible losses than he normally does.  His heart rate is also not alarmingly elevated making arrhythmia unlikely.  When I saw the patient, his heart rate was 88 and was sinus.  Also consider ACS but unlikely given no chest pain or shortness of breath and no evidence of ischemia on EKG.  Advised patient to follow-up with his PCP  for his tachycardia and encouraged him to hydrate more assertively.  Also considered PE but unlikely given heart rate normalized during encounter and patient denies shortness of breath, chest pain and calf tenderness.  Patient management required discussion with the following services or consulting groups:  None  Complexity of Problems Addressed Acute complicated illness or Injury  Additional Data Reviewed and Analyzed Further history obtained from: Further history from spouse/family member, Past medical history and medications listed in the EMR, and Prior ED visit notes  Patient Encounter Risk Assessment None         Final Clinical Impression(s) / ED Diagnoses Final diagnoses:  Tachycardia    Rx / DC Orders  ED Discharge Orders     None         Lindell Spar 03/14/23 Lilly Cove, MD 03/15/23 1239

## 2023-04-02 ENCOUNTER — Other Ambulatory Visit (HOSPITAL_COMMUNITY)
Admission: RE | Admit: 2023-04-02 | Discharge: 2023-04-02 | Disposition: A | Discharge status: Home / Self Care | Payer: 59 | Source: Ambulatory Visit | Attending: Gastroenterology | Admitting: Gastroenterology

## 2023-04-02 DIAGNOSIS — K219 Gastro-esophageal reflux disease without esophagitis: Secondary | ICD-10-CM | POA: Insufficient documentation

## 2023-04-02 DIAGNOSIS — R195 Other fecal abnormalities: Secondary | ICD-10-CM

## 2023-04-02 LAB — BASIC METABOLIC PANEL
Anion gap: 8 (ref 5–15)
BUN: 18 mg/dL (ref 6–20)
CO2: 26 mmol/L (ref 22–32)
Calcium: 9.2 mg/dL (ref 8.9–10.3)
Chloride: 105 mmol/L (ref 98–111)
Creatinine, Ser: 1.12 mg/dL (ref 0.61–1.24)
GFR, Estimated: >60 mL/min (ref >60)
Glucose, Bld: 109 mg/dL — ABNORMAL HIGH (ref 70–99)
Potassium: 4.5 mmol/L (ref 3.5–5.1)
Sodium: 139 mmol/L (ref 135–145)

## 2023-04-06 ENCOUNTER — Encounter: Payer: Self-pay | Admitting: *Deleted

## 2023-04-06 ENCOUNTER — Telehealth (INDEPENDENT_AMBULATORY_CARE_PROVIDER_SITE_OTHER): Payer: Self-pay | Admitting: Gastroenterology

## 2023-04-06 ENCOUNTER — Telehealth: Payer: Self-pay | Admitting: *Deleted

## 2023-04-06 NOTE — Telephone Encounter (Signed)
Pt left voicemail in regards to losing instructions.  Contacted pt and went over instructions with him.

## 2023-04-06 NOTE — Telephone Encounter (Signed)
Pt was at pharmacy and needed to know he needed to get to help prep for his colonoscopy. Advised pt to get fleet enema and bisacodyl 5 mg. Verbalized understanding.

## 2023-04-08 ENCOUNTER — Ambulatory Visit (HOSPITAL_BASED_OUTPATIENT_CLINIC_OR_DEPARTMENT_OTHER): Payer: 59 | Admitting: Anesthesiology

## 2023-04-08 ENCOUNTER — Other Ambulatory Visit: Payer: Self-pay

## 2023-04-08 ENCOUNTER — Ambulatory Visit (HOSPITAL_COMMUNITY): Payer: 59 | Admitting: Anesthesiology

## 2023-04-08 ENCOUNTER — Encounter (HOSPITAL_COMMUNITY)
Admission: RE | Disposition: A | Discharge status: Home / Self Care | Payer: Self-pay | Source: Home / Self Care | Attending: Gastroenterology

## 2023-04-08 ENCOUNTER — Ambulatory Visit (HOSPITAL_COMMUNITY)
Admission: RE | Admit: 2023-04-08 | Discharge: 2023-04-08 | Disposition: A | Discharge status: Home / Self Care | Payer: 59 | Attending: Gastroenterology | Admitting: Gastroenterology

## 2023-04-08 ENCOUNTER — Encounter (HOSPITAL_COMMUNITY): Payer: Self-pay | Admitting: Gastroenterology

## 2023-04-08 ENCOUNTER — Encounter (INDEPENDENT_AMBULATORY_CARE_PROVIDER_SITE_OTHER): Payer: Self-pay | Admitting: *Deleted

## 2023-04-08 DIAGNOSIS — R195 Other fecal abnormalities: Secondary | ICD-10-CM | POA: Diagnosis not present

## 2023-04-08 DIAGNOSIS — K649 Unspecified hemorrhoids: Secondary | ICD-10-CM

## 2023-04-08 DIAGNOSIS — R7989 Other specified abnormal findings of blood chemistry: Secondary | ICD-10-CM | POA: Diagnosis not present

## 2023-04-08 DIAGNOSIS — Z87891 Personal history of nicotine dependence: Secondary | ICD-10-CM | POA: Diagnosis not present

## 2023-04-08 DIAGNOSIS — D128 Benign neoplasm of rectum: Secondary | ICD-10-CM | POA: Diagnosis not present

## 2023-04-08 DIAGNOSIS — K621 Rectal polyp: Secondary | ICD-10-CM

## 2023-04-08 DIAGNOSIS — D122 Benign neoplasm of ascending colon: Secondary | ICD-10-CM | POA: Diagnosis not present

## 2023-04-08 DIAGNOSIS — R079 Chest pain, unspecified: Secondary | ICD-10-CM | POA: Diagnosis not present

## 2023-04-08 DIAGNOSIS — I10 Essential (primary) hypertension: Secondary | ICD-10-CM | POA: Insufficient documentation

## 2023-04-08 DIAGNOSIS — K648 Other hemorrhoids: Secondary | ICD-10-CM | POA: Insufficient documentation

## 2023-04-08 DIAGNOSIS — K635 Polyp of colon: Secondary | ICD-10-CM

## 2023-04-08 DIAGNOSIS — K219 Gastro-esophageal reflux disease without esophagitis: Secondary | ICD-10-CM | POA: Insufficient documentation

## 2023-04-08 DIAGNOSIS — Z1211 Encounter for screening for malignant neoplasm of colon: Secondary | ICD-10-CM | POA: Diagnosis not present

## 2023-04-08 DIAGNOSIS — D124 Benign neoplasm of descending colon: Secondary | ICD-10-CM

## 2023-04-08 HISTORY — PX: ESOPHAGOGASTRODUODENOSCOPY (EGD) WITH PROPOFOL: SHX5813

## 2023-04-08 HISTORY — PX: BIOPSY: SHX5522

## 2023-04-08 HISTORY — PX: POLYPECTOMY: SHX149

## 2023-04-08 HISTORY — PX: COLONOSCOPY WITH PROPOFOL: SHX5780

## 2023-04-08 LAB — HM COLONOSCOPY

## 2023-04-08 SURGERY — COLONOSCOPY WITH PROPOFOL
Anesthesia: General

## 2023-04-08 MED ORDER — PHENYLEPHRINE 80 MCG/ML (10ML) SYRINGE FOR IV PUSH (FOR BLOOD PRESSURE SUPPORT)
PREFILLED_SYRINGE | INTRAVENOUS | Status: DC | PRN
  Administered 2023-04-08: 80 ug via INTRAVENOUS

## 2023-04-08 MED ORDER — LACTATED RINGERS IV SOLN: INTRAVENOUS | Status: DC | PRN

## 2023-04-08 MED ORDER — STERILE WATER FOR IRRIGATION IR SOLN
Status: DC | PRN
  Administered 2023-04-08 (×2): 60 mL

## 2023-04-08 MED ORDER — LACTATED RINGERS IV SOLN: INTRAVENOUS | Status: DC

## 2023-04-08 MED ORDER — PROPOFOL 10 MG/ML IV BOLUS
INTRAVENOUS | Status: DC | PRN
  Administered 2023-04-08 (×2): 50 mg via INTRAVENOUS
  Administered 2023-04-08: 100 mg via INTRAVENOUS
  Administered 2023-04-08: 50 mg via INTRAVENOUS
  Administered 2023-04-08: 30 mg via INTRAVENOUS
  Administered 2023-04-08 (×2): 50 mg via INTRAVENOUS
  Administered 2023-04-08: 100 mg via INTRAVENOUS
  Administered 2023-04-08: 40 mg via INTRAVENOUS
  Administered 2023-04-08 (×2): 50 mg via INTRAVENOUS
  Administered 2023-04-08: 30 mg via INTRAVENOUS
  Administered 2023-04-08 (×3): 50 mg via INTRAVENOUS

## 2023-04-08 MED ORDER — LIDOCAINE 2% (20 MG/ML) 5 ML SYRINGE
INTRAMUSCULAR | Status: DC | PRN
  Administered 2023-04-08: 60 mg via INTRAVENOUS

## 2023-04-08 NOTE — Interval H&P Note (Signed)
History and Physical Interval Note:  04/08/2023 8:43 AM  Andrew Fields  has presented today for surgery, with the diagnosis of positive cologuard,GERD,chest pain.  The various methods of treatment have been discussed with the patient and family. After consideration of risks, benefits and other options for treatment, the patient has consented to  Procedure(s) with comments: COLONOSCOPY WITH PROPOFOL (N/A) - 9:30 am ESOPHAGOGASTRODUODENOSCOPY (EGD) WITH PROPOFOL (N/A) as a surgical intervention.  The patient's history has been reviewed, patient examined, no change in status, stable for surgery.  I have reviewed the patient's chart and labs.  Questions were answered to the patient's satisfaction.     Katrinka Blazing Mayorga

## 2023-04-08 NOTE — Op Note (Signed)
[facility] Patient Name: Andrew Fields Procedure Date: 04/08/2023 9:24 AM MRN: [id number] Date of Birth: 20-Aug-1976 Attending MD: Katrinka Blazing , , [id number] CSN: [id number] Age: 47 Admit Type: Outpatient Procedure:                Colonoscopy Indications:              Positive Cologuard test Providers:                Katrinka Blazing, Crystal Page, Kristine L. Jessee Avers, Technician Referring MD:              Medicines:                Monitored Anesthesia Care Complications:            No immediate complications. Estimated Blood Loss:     Estimated blood loss: none. Procedure:                Pre-Anesthesia Assessment:                           - Prior to the procedure, a History and Physical                            was performed, and patient medications, allergies                            and sensitivities were reviewed. The patient's                            tolerance of previous anesthesia was reviewed.                           - The risks and benefits of the procedure and the                            sedation options and risks were discussed with the                            patient. All questions were answered and informed                            consent was obtained.                           - ASA Grade Assessment: I - A normal, healthy                            patient.                           After obtaining informed consent, the colonoscope                            was passed under direct vision. Throughout the  procedure, the patient's blood pressure, pulse, and                            oxygen saturations were monitored continuously. The                            [id number]-[id number] ([id number] scope was introduced through                            the anus and advanced to the the cecum, identified                            by appendiceal orifice and ileocecal valve. The                             colonoscopy was performed without difficulty. The                            patient tolerated the procedure well. The quality                            of the bowel preparation was good. Scope In: 9:26:43 AM Scope Out: 9:52:39 AM Scope Withdrawal Time: 0 hours 17 minutes 17 seconds  Total Procedure Duration: 0 hours 25 minutes 56 seconds  Findings:      The perianal and digital rectal examinations were normal.      A 1 mm polyp was found in the ascending colon. The polyp was sessile.       The polyp was removed with a cold biopsy forceps. Resection and       retrieval were complete.      A 3 mm polyp was found in the ascending colon. The polyp was sessile.       The polyp was removed with a cold snare. Resection and retrieval were       complete.      Two sessile polyps were found in the rectum and descending colon. The       polyps were 3 to 4 mm in size. These polyps were removed with a cold       snare. Resection and retrieval were complete.      Non-bleeding internal hemorrhoids were found during retroflexion. The       hemorrhoids were small. Impression:               - One 1 mm polyp in the ascending colon, removed                            with a cold biopsy forceps. Resected and retrieved.                           - One 3 mm polyp in the ascending colon, removed                            with a cold snare. Resected and retrieved.                           -  Two 3 to 4 mm polyps in the rectum and in the                            descending colon, removed with a cold snare.                            Resected and retrieved.                           - Non-bleeding internal hemorrhoids. Moderate Sedation:      Per Anesthesia Care Recommendation:           - Discharge patient to home (ambulatory).                           - Resume previous diet.                           - Await pathology results.                           - Repeat colonoscopy for surveillance based on                             pathology results. Procedure Code(s):        --- Professional ---                           [phone], Colonoscopy, flexible; with removal of                            tumor(s), polyp(s), or other lesion(s) by snare                            technique                           45380, 59, Colonoscopy, flexible; with biopsy,                            single or multiple Diagnosis Code(s):        --- Professional ---                           D12.2, Benign neoplasm of ascending colon                           D12.8, Benign neoplasm of rectum                           D12.4, Benign neoplasm of descending colon                           K64.8, Other hemorrhoids                           R19.5, Other fecal abnormalities CPT copyright 2022 American Medical Association. All rights reserved. The  codes documented in this report are preliminary and upon coder review may  be revised to meet current compliance requirements. Katrinka Blazing, MD Katrinka Blazing,  04/08/2023 9:59:15 AM This report has been signed electronically. Number of Addenda: 0

## 2023-04-08 NOTE — Op Note (Signed)
[facility] Patient Name: Paublo Zimmerle Procedure Date: 04/08/2023 9:02 AM MRN: [id number] Date of Birth: 1976-05-22 Attending MD: Katrinka Blazing , , [id number] CSN: [id number] Age: 47 Admit Type: Outpatient Procedure:                Upper GI endoscopy Indications:              Suspected gastro-esophageal reflux disease Providers:                Katrinka Blazing, Crystal Page, Kristine L. Jessee Avers, Technician Referring MD:              Medicines:                Monitored Anesthesia Care Complications:            No immediate complications. Estimated Blood Loss:     Estimated blood loss: none. Procedure:                Pre-Anesthesia Assessment:                           - Prior to the procedure, a History and Physical                            was performed, and patient medications, allergies                            and sensitivities were reviewed. The patient's                            tolerance of previous anesthesia was reviewed.                           - The risks and benefits of the procedure and the                            sedation options and risks were discussed with the                            patient. All questions were answered and informed                            consent was obtained.                           - ASA Grade Assessment: I - A normal, healthy                            patient.                           After obtaining informed consent, the endoscope was                            passed under direct vision. Throughout the  procedure, the patient's blood pressure, pulse, and                            oxygen saturations were monitored continuously. The                            [id number]-[id number] ([id number]) scope was introduced through the                            mouth, and advanced to the second part of duodenum.                            The upper GI endoscopy was accomplished  without                            difficulty. The patient tolerated the procedure                            well. Scope In: 9:16:37 AM Scope Out: 9:22:10 AM Total Procedure Duration: 0 hours 5 minutes 33 seconds  Findings:      The examined esophagus was normal.      The entire examined stomach was normal. Biopsies were taken with a cold       forceps for Helicobacter pylori testing.      The examined duodenum was normal. Impression:               - Normal esophagus.                           - Normal stomach. Biopsied.                           - Normal examined duodenum. Moderate Sedation:      Per Anesthesia Care Recommendation:           - Discharge patient to home (ambulatory).                           - Resume previous diet.                           - Await pathology results.                           - Continue pantoprazole 40 mg in AM and famotidne                            40 mg at night. Procedure Code(s):        --- Professional ---                           [phone], Esophagogastroduodenoscopy, flexible,                            transoral; with biopsy, single or multiple Diagnosis Code(s):        --- Professional ---  K21.9, Gastro-esophageal reflux disease without                            esophagitis CPT copyright 2022 American Medical Association. All rights reserved. The codes documented in this report are preliminary and upon coder review may  be revised to meet current compliance requirements. Katrinka Blazinganiel Castaneda, MD Katrinka Blazinganiel Castaneda,  04/08/2023 9:54:46 AM This report has been signed electronically. Number of Addenda: 0

## 2023-04-08 NOTE — Discharge Instructions (Addendum)
You are being discharged to home.  Resume your previous diet.  We are waiting for your pathology results. Continue pantoprazole 40 mg in AM and famotidne 40 mg at night.  Your physician has recommended a repeat colonoscopy for surveillance based on pathology results.

## 2023-04-08 NOTE — Transfer of Care (Signed)
Immediate Anesthesia Transfer of Care Note  Patient: Andrew Fields  Procedure(s) Performed: COLONOSCOPY WITH PROPOFOL ESOPHAGOGASTRODUODENOSCOPY (EGD) WITH PROPOFOL BIOPSY POLYPECTOMY INTESTINAL  Patient Location: PACU  Anesthesia Type:MAC  Level of Consciousness: drowsy and patient cooperative  Airway & Oxygen Therapy: Patient Spontanous Breathing and Patient connected to nasal cannula oxygen  Post-op Assessment: Report given to RN and Post -op Vital signs reviewed and stable  Post vital signs: Reviewed and stable  Last Vitals:  Vitals Value Taken Time  BP 101/60 04/08/23 0955  Temp 36.5 C 04/08/23 0955  Pulse 78 04/08/23 0955  Resp 18 04/08/23 0955  SpO2 94 % 04/08/23 0955    Last Pain:  Vitals:   04/08/23 0955  TempSrc: Axillary  PainSc:       Patients Stated Pain Goal: 6 (04/08/23 0807)  Complications: No notable events documented.

## 2023-04-08 NOTE — Anesthesia Procedure Notes (Signed)
Procedure Name: MAC Date/Time: 04/08/2023 9:21 AM  Performed by: Adria Dill, CRNAPre-anesthesia Checklist: Patient identified, Emergency Drugs available, Suction available and Patient being monitored Patient Re-evaluated:Patient Re-evaluated prior to induction Oxygen Delivery Method: Nasal cannula Preoxygenation: Pre-oxygenation with 100% oxygen Induction Type: IV induction Airway Equipment and Method: Bite block Placement Confirmation: positive ETCO2 and breath sounds checked- equal and bilateral Dental Injury: Teeth and Oropharynx as per pre-operative assessment

## 2023-04-08 NOTE — Anesthesia Preprocedure Evaluation (Signed)
Anesthesia Evaluation  Patient identified by MRN, date of birth, ID band Patient awake    Reviewed: Allergy & Precautions, H&P , NPO status , Patient's Chart, lab work & pertinent test results, reviewed documented beta blocker date and time   Airway Mallampati: II  TM Distance: >3 FB Neck ROM: full    Dental no notable dental hx.    Pulmonary neg pulmonary ROS, former smoker   Pulmonary exam normal breath sounds clear to auscultation       Cardiovascular Exercise Tolerance: Good hypertension, negative cardio ROS  Rhythm:regular Rate:Normal     Neuro/Psych negative neurological ROS  negative psych ROS   GI/Hepatic negative GI ROS, Neg liver ROS,GERD  ,,  Endo/Other  negative endocrine ROS    Renal/GU negative Renal ROS  negative genitourinary   Musculoskeletal   Abdominal   Peds  Hematology negative hematology ROS (+)   Anesthesia Other Findings   Reproductive/Obstetrics negative OB ROS                             Anesthesia Physical Anesthesia Plan  ASA: 2  Anesthesia Plan: General   Post-op Pain Management:    Induction:   PONV Risk Score and Plan: Propofol infusion  Airway Management Planned:   Additional Equipment:   Intra-op Plan:   Post-operative Plan:   Informed Consent: I have reviewed the patients History and Physical, chart, labs and discussed the procedure including the risks, benefits and alternatives for the proposed anesthesia with the patient or authorized representative who has indicated his/her understanding and acceptance.     Dental Advisory Given  Plan Discussed with: CRNA  Anesthesia Plan Comments:        Anesthesia Quick Evaluation  

## 2023-04-09 LAB — SURGICAL PATHOLOGY

## 2023-04-09 NOTE — Anesthesia Postprocedure Evaluation (Signed)
Anesthesia Post Note  Patient: MONISH WILLNER  Procedure(s) Performed: COLONOSCOPY WITH PROPOFOL ESOPHAGOGASTRODUODENOSCOPY (EGD) WITH PROPOFOL BIOPSY POLYPECTOMY INTESTINAL  Patient location during evaluation: Phase II Anesthesia Type: General Level of consciousness: awake Pain management: pain level controlled Vital Signs Assessment: post-procedure vital signs reviewed and stable Respiratory status: spontaneous breathing and respiratory function stable Cardiovascular status: blood pressure returned to baseline and stable Postop Assessment: no headache and no apparent nausea or vomiting Anesthetic complications: no Comments: Late entry   No notable events documented.   Last Vitals:  Vitals:   04/08/23 0807 04/08/23 0955  BP: 135/80 101/60  Pulse: 83 78  Resp: 19 18  Temp: 36.9 C 36.5 C  SpO2: 96% 94%    Last Pain:  Vitals:   04/08/23 0955  TempSrc: Axillary  PainSc:                  Windell Norfolk

## 2023-04-15 ENCOUNTER — Encounter (HOSPITAL_COMMUNITY): Payer: Self-pay | Admitting: Gastroenterology

## 2023-05-12 ENCOUNTER — Ambulatory Visit: Payer: 59 | Admitting: Cardiology

## 2023-05-12 NOTE — Progress Notes (Deleted)
Clinical Summary Mr. Lazo is a 47 y.o.male  seen today as a new patient for chest pain.    1. Chest pain - seen at [facility] 09/2019 with chest pain - CXR no acute process, trop neg, EKG SR no ischemic chagnes - discharged from ER with prednisone.    - symptoms started a few months ago       - reports in his early 30s started having anxiety attacks - seen by cardiology around that time in [place], [place]. Reports had stress test then that was benign - since that time has had intermittent symptoms   - symptoms recently have been different.  - pressure midchest to left side. Can occur at rest or with activity, mostly in the evening. Can occur with sitting watching tv or walking around. 8/10 in severity. Mild nausea. Can last 30 minutes up to a couple of hours. Sometimes can be mildly tender to palpation. Increase in frequency and severity. Occurs daily   - sedentary lifestyle due to recent knee injury. Can have some chest pain with activities, fatigue - priolosec not better - prednisone did not help. - can have some chest pressures after coffee or beer.      CAD risk factors: lisinopril, former tobacco x 15 years, mom MI age 75 (he reports she had severe diabetes as well)   09/2019 GXT: no ischemia, Duke score 7 low risk 10/2020 coronary CTA: calcium score 0, no CAD  Seen by GI, started on PPI EGD was benign  2.HTN -   Past Medical History:  Diagnosis Date   Hypertension      No Known Allergies   Current Outpatient Medications  Medication Sig Dispense Refill   ALPRAZolam (XANAX) 1 MG tablet Take 1 tablet by mouth daily as needed for anxiety.     famotidine (PEPCID) 40 MG tablet Take 40 mg by mouth daily.     folic acid (FOLVITE) 1 MG tablet Take 1 mg by mouth daily.     hydrochlorothiazide (HYDRODIURIL) 25 MG tablet Take 25 mg by mouth daily.     lisinopril (ZESTRIL) 20 MG tablet Take 20 mg by mouth daily.     Multiple Vitamins-Minerals (MULTIVITAMIN WITH  MINERALS) tablet Take 1 tablet by mouth daily.     Omega-3 Fatty Acids (FISH OIL) 1000 MG CAPS Take 1,000 mg by mouth daily.     pantoprazole (PROTONIX) 40 MG tablet Take 1 tablet (40 mg total) by mouth daily before breakfast. 30 tablet 3   No current facility-administered medications for this visit.     Past Surgical History:  Procedure Laterality Date   BIOPSY  04/08/2023   Procedure: BIOPSY;  Surgeon: Dolores Frame, MD;  [facility];  Service: Gastroenterology;;   COLONOSCOPY WITH PROPOFOL N/A 04/08/2023   Procedure: COLONOSCOPY WITH PROPOFOL;  Surgeon: Dolores Frame, MD;  [facility];  Service: Gastroenterology;  Laterality: N/A;  9:30 am   ESOPHAGOGASTRODUODENOSCOPY (EGD) WITH PROPOFOL N/A 04/08/2023   Procedure: ESOPHAGOGASTRODUODENOSCOPY (EGD) WITH PROPOFOL;  Surgeon: Dolores Frame, MD;  [facility]: [facility];  Service: Gastroenterology;  Laterality: N/A;   KNEE ARTHROSCOPY W/ MENISCAL REPAIR Right 06/2019   POLYPECTOMY  04/08/2023   Procedure: POLYPECTOMY INTESTINAL;  Surgeon: Dolores Frame, MD;  [facility]: [facility];  Service: Gastroenterology;;     No Known Allergies    Family History  Problem Relation Age of Onset   Hypertension Mother    Hypertension Father  Social History Mr. Finkel reports that he has quit smoking. His smokeless tobacco use includes chew. Mr. Presha reports current alcohol use.   Review of Systems CONSTITUTIONAL: No weight loss, fever, chills, weakness or fatigue.  HEENT: Eyes: No visual loss, blurred vision, double vision or yellow sclerae.No hearing loss, sneezing, congestion, runny nose or sore throat.  SKIN: No rash or itching.  CARDIOVASCULAR:  RESPIRATORY: No shortness of breath, cough or sputum.  GASTROINTESTINAL: No anorexia, nausea, vomiting or diarrhea. No abdominal pain or blood.  GENITOURINARY: No burning on urination, no polyuria NEUROLOGICAL: No  headache, dizziness, syncope, paralysis, ataxia, numbness or tingling in the extremities. No change in bowel or bladder control.  MUSCULOSKELETAL: No muscle, back pain, joint pain or stiffness.  LYMPHATICS: No enlarged nodes. No history of splenectomy.  PSYCHIATRIC: No history of depression or anxiety.  ENDOCRINOLOGIC: No reports of sweating, cold or heat intolerance. No polyuria or polydipsia.  Marland Kitchen   Physical Examination There were no vitals filed for this visit. There were no vitals filed for this visit.  Gen: resting comfortably, no acute distress HEENT: no scleral icterus, pupils equal round and reactive, no palptable cervical adenopathy,  CV Resp: Clear to auscultation bilaterally GI: abdomen is soft, non-tender, non-distended, normal bowel sounds, no hepatosplenomegaly MSK: extremities are warm, no edema.  Skin: warm, no rash Neuro:  no focal deficits Psych: appropriate affect   Diagnostic Studies 09/2019 GXT Blood pressure demonstrated a hypertensive response to exercise. There was no ST segment deviation noted during stress. Negative exercise stress test for ischemia. Duke treadmill score of 7 supports low risk for major cardiac events. Exercise capacity is 90% of predicted based on age and gender    Assessment and Plan  1. Chest pain - mixed somewhat atypical symptoms, has not improved with recent emperic trials of antacid and prednisone - he does have risk factors including his mother with MI in her early 16s - will plan for a GXT to further evaluate       F/u pending stress test results. If negative GXT would consider GI referal, I don't believe he has a pcp so we would need to make      Antoine Poche, M.D., F.A.C.C.

## 2023-07-03 DIAGNOSIS — Z720 Tobacco use: Secondary | ICD-10-CM | POA: Diagnosis not present

## 2023-07-03 DIAGNOSIS — F419 Anxiety disorder, unspecified: Secondary | ICD-10-CM | POA: Diagnosis not present

## 2023-07-03 DIAGNOSIS — I10 Essential (primary) hypertension: Secondary | ICD-10-CM | POA: Diagnosis not present

## 2023-07-03 DIAGNOSIS — K219 Gastro-esophageal reflux disease without esophagitis: Secondary | ICD-10-CM | POA: Diagnosis not present

## 2023-07-21 DIAGNOSIS — M519 Unspecified thoracic, thoracolumbar and lumbosacral intervertebral disc disorder: Secondary | ICD-10-CM | POA: Diagnosis not present

## 2023-07-21 DIAGNOSIS — Z6838 Body mass index (BMI) 38.0-38.9, adult: Secondary | ICD-10-CM | POA: Diagnosis not present

## 2023-07-21 DIAGNOSIS — F419 Anxiety disorder, unspecified: Secondary | ICD-10-CM | POA: Diagnosis not present

## 2023-07-21 DIAGNOSIS — I10 Essential (primary) hypertension: Secondary | ICD-10-CM | POA: Diagnosis not present

## 2023-07-21 DIAGNOSIS — M545 Low back pain, unspecified: Secondary | ICD-10-CM | POA: Diagnosis not present

## 2023-07-21 DIAGNOSIS — R079 Chest pain, unspecified: Secondary | ICD-10-CM | POA: Diagnosis not present

## 2023-10-07 ENCOUNTER — Other Ambulatory Visit: Payer: Self-pay

## 2023-10-07 ENCOUNTER — Emergency Department (HOSPITAL_COMMUNITY)
Admission: EM | Admit: 2023-10-07 | Discharge: 2023-10-07 | Disposition: A | Discharge status: Home / Self Care | Payer: 59 | Attending: Student | Admitting: Student

## 2023-10-07 ENCOUNTER — Emergency Department (HOSPITAL_COMMUNITY): Payer: 59

## 2023-10-07 ENCOUNTER — Encounter (HOSPITAL_COMMUNITY): Payer: Self-pay

## 2023-10-07 DIAGNOSIS — Z79899 Other long term (current) drug therapy: Secondary | ICD-10-CM | POA: Diagnosis not present

## 2023-10-07 DIAGNOSIS — R1011 Right upper quadrant pain: Secondary | ICD-10-CM | POA: Diagnosis not present

## 2023-10-07 DIAGNOSIS — I10 Essential (primary) hypertension: Secondary | ICD-10-CM | POA: Insufficient documentation

## 2023-10-07 DIAGNOSIS — R109 Unspecified abdominal pain: Secondary | ICD-10-CM | POA: Diagnosis not present

## 2023-10-07 DIAGNOSIS — D72829 Elevated white blood cell count, unspecified: Secondary | ICD-10-CM | POA: Insufficient documentation

## 2023-10-07 LAB — COMPREHENSIVE METABOLIC PANEL
ALT: 40 U/L (ref 0–44)
AST: 43 U/L — ABNORMAL HIGH (ref 15–41)
Albumin: 3.9 g/dL (ref 3.5–5.0)
Alkaline Phosphatase: 67 U/L (ref 38–126)
Anion gap: 10 (ref 5–15)
BUN: 18 mg/dL (ref 6–20)
CO2: 28 mmol/L (ref 22–32)
Calcium: 9 mg/dL (ref 8.9–10.3)
Chloride: 99 mmol/L (ref 98–111)
Creatinine, Ser: 0.91 mg/dL (ref 0.61–1.24)
GFR, Estimated: >60 mL/min (ref >60)
Glucose, Bld: 122 mg/dL — ABNORMAL HIGH (ref 70–99)
Potassium: 3.6 mmol/L (ref 3.5–5.1)
Sodium: 137 mmol/L (ref 135–145)
Total Bilirubin: 1.1 mg/dL (ref 0.3–1.2)
Total Protein: 7.4 g/dL (ref 6.5–8.1)

## 2023-10-07 LAB — CBC WITH DIFFERENTIAL/PLATELET
Abs Immature Granulocytes: 0.04 10*3/uL (ref 0.00–0.07)
Basophils Absolute: 0.1 10*3/uL (ref 0.0–0.1)
Basophils Relative: 1 %
Eosinophils Absolute: 0.2 10*3/uL (ref 0.0–0.5)
Eosinophils Relative: 2 %
HCT: 46.1 % (ref 39.0–52.0)
Hemoglobin: 16 g/dL (ref 13.0–17.0)
Immature Granulocytes: 0 %
Lymphocytes Relative: 17 %
Lymphs Abs: 1.8 10*3/uL (ref 0.7–4.0)
MCH: 30.2 pg (ref 26.0–34.0)
MCHC: 34.7 g/dL (ref 30.0–36.0)
MCV: 87 fL (ref 80.0–100.0)
Monocytes Absolute: 0.9 10*3/uL (ref 0.1–1.0)
Monocytes Relative: 8 %
Neutro Abs: 8 10*3/uL — ABNORMAL HIGH (ref 1.7–7.7)
Neutrophils Relative %: 72 %
Platelets: 189 10*3/uL (ref 150–400)
RBC: 5.3 MIL/uL (ref 4.22–5.81)
RDW: 14 % (ref 11.5–15.5)
WBC: 11 10*3/uL — ABNORMAL HIGH (ref 4.0–10.5)
nRBC: 0 % (ref 0.0–0.2)

## 2023-10-07 LAB — URINALYSIS, ROUTINE W REFLEX MICROSCOPIC
Bacteria, UA: NONE SEEN
Bilirubin Urine: NEGATIVE
Glucose, UA: NEGATIVE mg/dL
Ketones, ur: NEGATIVE mg/dL
Leukocytes,Ua: NEGATIVE
Nitrite: NEGATIVE
Protein, ur: 30 mg/dL — AB
Specific Gravity, Urine: 1.026 (ref 1.005–1.030)
pH: 5 (ref 5.0–8.0)

## 2023-10-07 LAB — LIPASE, BLOOD: Lipase: 33 U/L (ref 11–51)

## 2023-10-07 MED ORDER — DICYCLOMINE HCL 10 MG PO CAPS
20.0000 mg | ORAL_CAPSULE | Freq: Once | ORAL | Status: AC
  Administered 2023-10-07: 20 mg via ORAL
  Filled 2023-10-07: qty 2

## 2023-10-07 MED ORDER — IOHEXOL 300 MG/ML  SOLN
100.0000 mL | Freq: Once | INTRAMUSCULAR | Status: AC | PRN
  Administered 2023-10-07: 100 mL via INTRAVENOUS

## 2023-10-07 MED ORDER — DICYCLOMINE HCL 20 MG PO TABS: 20.0000 mg | ORAL_TABLET | Freq: Two times a day (BID) | ORAL | 0 refills | Status: DC | PRN

## 2023-10-07 MED ORDER — LIDOCAINE VISCOUS HCL 2 % MT SOLN
15.0000 mL | Freq: Once | OROMUCOSAL | Status: AC
  Administered 2023-10-07: 15 mL via ORAL
  Filled 2023-10-07: qty 15

## 2023-10-07 MED ORDER — ALUM & MAG HYDROXIDE-SIMETH 200-200-20 MG/5ML PO SUSP
30.0000 mL | Freq: Once | ORAL | Status: AC
  Administered 2023-10-07: 30 mL via ORAL
  Filled 2023-10-07: qty 30

## 2023-10-07 MED ORDER — MAALOX MAX 400-400-40 MG/5ML PO SUSP: 10.0000 mL | Freq: Four times a day (QID) | ORAL | 0 refills | Status: DC | PRN

## 2023-10-07 NOTE — ED Provider Notes (Signed)
[place] [facility] AT [facility] Provider Note  CSN: [id number] Arrival date & time: 10/07/23 1044  Chief Complaint(s) Abdominal Cramping  HPI Andrew Fields is a 47 y.o. male with PMH HTN, GERD who presents Emergency Department for evaluation of abdominal pain and cramping.  Patient states that he awoke this morning with severe episodes of intermittent abdominal cramping over the right upper quadrant and epigastrium.  States that this feels different than his typical GERD which is usually in the chest.  States that the symptoms persisted while at work and his abdominal pain flares last approximately 1 to 2 minutes but then self resolved.  Denies associated nausea, vomiting, diarrhea, bloody stools, headache, fever or other systemic symptoms.  Of note, patient did have an upper endoscopy and colonoscopy performed in April 2024 that was reassuringly unremarkable.   Past Medical History Past Medical History:  Diagnosis Date   Hypertension    Patient Active Problem List   Diagnosis Date Noted   Rectal bleeding 03/10/2023   Positive colorectal cancer screening using Cologuard test 03/10/2023   Elevated LFTs 07/03/2021   GERD (gastroesophageal reflux disease) 07/03/2021   Chest pain 10/22/2020   Family history of early CAD 10/22/2020   Essential hypertension 10/22/2020   Home Medication(s) Prior to Admission medications   Medication Sig Start Date End Date Taking? Authorizing Provider  alum & mag hydroxide-simeth (MAALOX MAX) 400-400-40 MG/5ML suspension Take 10 mLs by mouth every 6 (six) hours as needed for indigestion. 10/07/23  Yes Fahd Galea, MD  dicyclomine (BENTYL) 20 MG tablet Take 1 tablet (20 mg total) by mouth 2 (two) times daily as needed for spasms. 10/07/23  Yes Henri Guedes, MD  ALPRAZolam Prudy Feeler) 1 MG tablet Take 1 tablet by mouth daily as needed for anxiety. 02/02/23   [provider]  famotidine (PEPCID) 40 MG tablet Take 40 mg  by mouth daily. 02/02/23   [provider]  folic acid (FOLVITE) 1 MG tablet Take 1 mg by mouth daily.    [provider]  hydrochlorothiazide (HYDRODIURIL) 25 MG tablet Take 25 mg by mouth daily.    [provider]  lisinopril (ZESTRIL) 20 MG tablet Take 20 mg by mouth daily.    [provider]  Multiple Vitamins-Minerals (MULTIVITAMIN WITH MINERALS) tablet Take 1 tablet by mouth daily.    [provider]  Omega-3 Fatty Acids (FISH OIL) 1000 MG CAPS Take 1,000 mg by mouth daily.    [provider]  pantoprazole (PROTONIX) 40 MG tablet Take 1 tablet (40 mg total) by mouth daily before breakfast. 03/10/23   Letta Median, PA-C                                                                                                                                    Past Surgical History Past Surgical History:  Procedure Laterality Date   BIOPSY  04/08/2023   Procedure:  BIOPSY;  Surgeon: Marguerita Merles, Reuel Boom, MD;  [facility];  Service: Gastroenterology;;   COLONOSCOPY WITH PROPOFOL N/A 04/08/2023   Procedure: COLONOSCOPY WITH PROPOFOL;  Surgeon: Dolores Frame, MD;  [facility];  Service: Gastroenterology;  Laterality: N/A;  9:30 am   ESOPHAGOGASTRODUODENOSCOPY (EGD) WITH PROPOFOL N/A 04/08/2023   Procedure: ESOPHAGOGASTRODUODENOSCOPY (EGD) WITH PROPOFOL;  Surgeon: Dolores Frame, MD;  [facility];  Service: Gastroenterology;  Laterality: N/A;   KNEE ARTHROSCOPY W/ MENISCAL REPAIR Right 06/2019   POLYPECTOMY  04/08/2023   Procedure: POLYPECTOMY INTESTINAL;  Surgeon: Dolores Frame, MD;  [facility];  Service: Gastroenterology;;   Family History Family History  Problem Relation Age of Onset   Hypertension Mother    Hypertension Father     Social History Social History   Tobacco Use   Smoking status: Former   Smokeless tobacco: Current    Types: Careers information officer   Vaping status: Former   Substances: Nicotine-salt  Substance Use Topics   Alcohol use: Not Currently    Comment: occ   Drug use: Never   Allergies Patient has no known allergies.  Review of Systems Review of Systems  Gastrointestinal:  Positive for abdominal pain.    Physical Exam Vital Signs  I have reviewed the triage vital signs BP (!) 132/95   Pulse 66   Temp 97.8 F (36.6 C) (Oral)   Resp 18   Ht 6\' 5"  (1.956 m)   Wt 136.1 kg   SpO2 98%   BMI 35.57 kg/m   Physical Exam Constitutional:      General: He is not in acute distress.    Appearance: Normal appearance.  HENT:     Head: Normocephalic and atraumatic.     Nose: No congestion or rhinorrhea.  Eyes:     General:        Right eye: No discharge.        Left eye: No discharge.     Extraocular Movements: Extraocular movements intact.     Pupils: Pupils are equal, round, and reactive to light.  Cardiovascular:     Rate and Rhythm: Normal rate and regular rhythm.     Heart sounds: No murmur heard. Pulmonary:     Effort: No respiratory distress.     Breath sounds: No wheezing or rales.  Abdominal:     General: There is no distension.     Tenderness: There is abdominal tenderness.  Musculoskeletal:        General: Normal range of motion.     Cervical back: Normal range of motion.  Skin:    General: Skin is warm and dry.  Neurological:     General: No focal deficit present.     Mental Status: He is alert.     ED Results and Treatments Labs (all labs ordered are listed, but only abnormal results are displayed) Labs Reviewed  CBC WITH DIFFERENTIAL/PLATELET - Abnormal; Notable for the following components:      Result Value   WBC 11.0 (*)    Neutro Abs 8.0 (*)    All other components within normal limits  COMPREHENSIVE METABOLIC PANEL - Abnormal; Notable for the following components:   Glucose, Bld 122 (*)    AST 43 (*)    All other components within normal limits  URINALYSIS, ROUTINE W  REFLEX MICROSCOPIC - Abnormal; Notable for the following components:   Hgb urine dipstick SMALL (*)    Protein, ur  30 (*)    All other components within normal limits  LIPASE, BLOOD                                                                                                                          Radiology CT ABDOMEN PELVIS W CONTRAST  Result Date: 10/07/2023 CLINICAL DATA:  Intermittent abdominal cramping. EXAM: CT ABDOMEN AND PELVIS WITH CONTRAST TECHNIQUE: Multidetector CT imaging of the abdomen and pelvis was performed using the standard protocol following bolus administration of intravenous contrast. RADIATION DOSE REDUCTION: This exam was performed according to the departmental dose-optimization program which includes automated exposure control, adjustment of the mA and/or kV according to patient size and/or use of iterative reconstruction technique. CONTRAST:  OMNIPAQUE IOHEXOL 300 MG/ML  SOLN COMPARISON:  CT abdomen pelvis dated April 17, 2020. FINDINGS: Lower chest: No acute abnormality. Hepatobiliary: No focal liver abnormality is seen. No gallstones, gallbladder wall thickening, or biliary dilatation. Pancreas: Unremarkable. No pancreatic ductal dilatation or surrounding inflammatory changes. Spleen: Normal in size without focal abnormality. Adrenals/Urinary Tract: Adrenal glands are unremarkable. Kidneys are normal, without renal calculi, focal lesion, or hydronephrosis. Bladder is unremarkable. Stomach/Bowel: Stomach is within normal limits. Appendix appears normal. No evidence of bowel wall thickening, distention, or inflammatory changes. Vascular/Lymphatic: No significant vascular findings are present. No enlarged abdominal or pelvic lymph nodes. Reproductive: Prostate is unremarkable. Other: No abdominal wall hernia or abnormality. No abdominopelvic ascites. No pneumoperitoneum. Musculoskeletal: No acute or significant osseous findings. IMPRESSION: 1.  No acute intra-abdominal  process. Electronically Signed   By: Obie Dredge M.D.   On: 10/07/2023 14:32    Pertinent labs & imaging results that were available during my care of the patient were reviewed by me and considered in my medical decision making (see MDM for details).  Medications Ordered in ED Medications  iohexol (OMNIPAQUE) 300 MG/ML solution 100 mL (100 mLs Intravenous Contrast Given 10/07/23 1257)  dicyclomine (BENTYL) capsule 20 mg (20 mg Oral Given 10/07/23 1451)  alum & mag hydroxide-simeth (MAALOX/MYLANTA) 200-200-20 MG/5ML suspension 30 mL (30 mLs Oral Given 10/07/23 1451)    And  lidocaine (XYLOCAINE) 2 % viscous mouth solution 15 mL (15 mLs Oral Given 10/07/23 1452)  Procedures Procedures  (including critical care time)  Medical Decision Making / ED Course   This patient presents to the ED for concern of abdominal pain, this involves an extensive number of treatment options, and is a complaint that carries with it a high risk of complications and morbidity.  The differential diagnosis includes cholecystitis, cholelithiasis/biliary colic, choledocholithiasis, ascending cholangitis, acute hepatitis, pancreatitis, GERD, pneumonia, constipation, nephrolithiasis  MDM: Patient seen emergency room for evaluation of abdominal pain.  Physical exam with mild tenderness in the right upper quadrant and epigastrium but is otherwise unremarkable.  Laboratory evaluation reassuringly unremarkable outside of a mild leukocytosis to 11.0, AST 43.  Lipase is normal.  Urinalysis with a small amount of hematuria but is otherwise unremarkable.  CT abdomen pelvis unremarkable.  Patient did have return of his abdominal cramping in the emergency department but the symptoms improved significantly after receiving Bentyl and Maalox.  At this time, with negative imaging and reassuring laboratory  workup, patient does not meet inpatient criteria for admission and is safe for discharge with outpatient follow-up.  Prescriptions for Maalox and Bentyl sent to pharmacy.  Patient given return precautions of which she voiced understanding he was discharged.   Additional history obtained:  -External records from outside source obtained and reviewed including: Chart review including previous notes, labs, imaging, consultation notes   Lab Tests: -I ordered, reviewed, and interpreted labs.   The pertinent results include:   Labs Reviewed  CBC WITH DIFFERENTIAL/PLATELET - Abnormal; Notable for the following components:      Result Value   WBC 11.0 (*)    Neutro Abs 8.0 (*)    All other components within normal limits  COMPREHENSIVE METABOLIC PANEL - Abnormal; Notable for the following components:   Glucose, Bld 122 (*)    AST 43 (*)    All other components within normal limits  URINALYSIS, ROUTINE W REFLEX MICROSCOPIC - Abnormal; Notable for the following components:   Hgb urine dipstick SMALL (*)    Protein, ur 30 (*)    All other components within normal limits  LIPASE, BLOOD      Imaging Studies ordered: I ordered imaging studies including CTAP I independently visualized and interpreted imaging. I agree with the radiologist interpretation   Medicines ordered and prescription drug management: Meds ordered this encounter  Medications   iohexol (OMNIPAQUE) 300 MG/ML solution 100 mL   dicyclomine (BENTYL) 20 MG tablet    Sig: Take 1 tablet (20 mg total) by mouth 2 (two) times daily as needed for spasms.    Dispense:  20 tablet    Refill:  0   dicyclomine (BENTYL) capsule 20 mg   AND Linked Order Group    alum & mag hydroxide-simeth (MAALOX/MYLANTA) 200-200-20 MG/5ML suspension 30 mL    lidocaine (XYLOCAINE) 2 % viscous mouth solution 15 mL   alum & mag hydroxide-simeth (MAALOX MAX) 400-400-40 MG/5ML suspension    Sig: Take 10 mLs by mouth every 6 (six) hours as needed for  indigestion.    Dispense:  355 mL    Refill:  0    -I have reviewed the patients home medicines and have made adjustments as needed  Critical interventions none    Cardiac Monitoring: The patient was maintained on a cardiac monitor.  I personally viewed and interpreted the cardiac monitored which showed an underlying rhythm of: NSr  Social Determinants of Health:  Factors impacting patients care include: none   Reevaluation: After the interventions noted above, I reevaluated the patient  and found that they have :improved  Co morbidities that complicate the patient evaluation  Past Medical History:  Diagnosis Date   Hypertension       Dispostion: I considered admission for this patient, but at this time he does not meet inpatient criteria for admission he is safe for discharge with outpatient follow-up     Final Clinical Impression(s) / ED Diagnoses Final diagnoses:  Abdominal cramping     @PCDICTATION @    Glendora Score, MD 10/07/23 2201

## 2023-10-07 NOTE — ED Notes (Signed)
Introduced self to pt Pt complains of ABD "contractions" Pt stated that pain comes and goes.  Pain 9/10 when happening currently 4/10. Denies nausea, stated he had 1 loose stool this morning.  Pt walked to restroom for UA IV access established Labs drawn Pt placed on partial monitor   Waiting for CT

## 2023-10-07 NOTE — ED Notes (Signed)
Reattached to partial monitor Vitals listed Waiting on CT results

## 2023-10-07 NOTE — ED Triage Notes (Signed)
Pt reports intermittent mid abdominal cramping today.

## 2024-03-29 ENCOUNTER — Ambulatory Visit: Payer: Self-pay | Admitting: Internal Medicine

## 2024-05-15 ENCOUNTER — Telehealth: Payer: Self-pay | Admitting: Family Medicine

## 2024-05-15 NOTE — Telephone Encounter (Signed)
 Copied from CRM [id number]. Topic: Appointments - Scheduling Inquiry for Clinic >> May 15, 2024  4:09 PM Tiffany B wrote: Reason for CRM: Patient had a NPA on 03/29/2024 with Dr. Lydia Sams and his appt was cancelled and the cancel reason reflect: Provider (called patient 4.2 lvm to call to reschedule appointment. text was sent early AM).  Patient returned call in April and was informed Dr. Lydia Sams was not accepting, can we make an exception since it was the provider who cancelled. Patient prefers a male M  *Patient and son appointment was both cancelled please check with Dr. Lydia Sams and see if he will make exception and follow up with patient.

## 2024-05-16 NOTE — Telephone Encounter (Signed)
 Patient scheduled with Dr Lydia Sams on 7/15

## 2024-06-14 ENCOUNTER — Ambulatory Visit: Admitting: Orthopedic Surgery

## 2024-07-11 ENCOUNTER — Ambulatory Visit: Admitting: Internal Medicine

## 2024-07-11 ENCOUNTER — Encounter: Payer: Self-pay | Admitting: Internal Medicine

## 2024-07-11 VITALS — BP 129/77 | HR 84 | Ht 77.0 in | Wt 234.0 lb

## 2024-07-11 DIAGNOSIS — K219 Gastro-esophageal reflux disease without esophagitis: Secondary | ICD-10-CM | POA: Diagnosis not present

## 2024-07-11 DIAGNOSIS — E663 Overweight: Secondary | ICD-10-CM | POA: Insufficient documentation

## 2024-07-11 DIAGNOSIS — I10 Essential (primary) hypertension: Secondary | ICD-10-CM

## 2024-07-11 DIAGNOSIS — F410 Panic disorder [episodic paroxysmal anxiety] without agoraphobia: Secondary | ICD-10-CM | POA: Diagnosis not present

## 2024-07-11 DIAGNOSIS — R739 Hyperglycemia, unspecified: Secondary | ICD-10-CM

## 2024-07-11 DIAGNOSIS — Z1159 Encounter for screening for other viral diseases: Secondary | ICD-10-CM

## 2024-07-11 DIAGNOSIS — E559 Vitamin D deficiency, unspecified: Secondary | ICD-10-CM

## 2024-07-11 DIAGNOSIS — E782 Mixed hyperlipidemia: Secondary | ICD-10-CM | POA: Insufficient documentation

## 2024-07-11 DIAGNOSIS — Z114 Encounter for screening for human immunodeficiency virus [HIV]: Secondary | ICD-10-CM

## 2024-07-11 MED ORDER — ALPRAZOLAM 1 MG PO TABS: 1.0000 mg | ORAL_TABLET | Freq: Every day | ORAL | 1 refills | Status: DC | PRN

## 2024-07-11 MED ORDER — LISINOPRIL 20 MG PO TABS: 20.0000 mg | ORAL_TABLET | Freq: Every day | ORAL | 5 refills | Status: DC

## 2024-07-11 NOTE — Assessment & Plan Note (Signed)
 Check lipid profile He takes fish oil, advised to continue to take it Advised to follow DASH diet

## 2024-07-11 NOTE — Assessment & Plan Note (Addendum)
 Well controlled now Avoid hot and spicy food DC pantoprazole  and Pepcid as he anyways does not take them now

## 2024-07-11 NOTE — Assessment & Plan Note (Addendum)
 Overall well-controlled with Xanax  1 mg as needed, does not take it daily - PDMP reviewed, refilled Has not responded well to SSRI

## 2024-07-11 NOTE — Assessment & Plan Note (Signed)
 BMI Readings from Last 3 Encounters:  07/11/24 27.75 kg/m  10/07/23 35.57 kg/m  04/08/23 36.52 kg/m   Has lost about 80 pounds in the last 5 months Advised to continue to follow low-carb diet and perform moderate exercise/walking at least 150 minutes/week

## 2024-07-11 NOTE — Progress Notes (Signed)
 New Patient Office Visit  Subjective:  Patient ID: Andrew Fields, male    DOB: Dec 06, 1976  Age: 48 y.o. MRN: [id number]  CC:  Chief Complaint  Patient presents with   Establish Care    New patient establishing care.    Anxiety    Has sx of anxiety, panic attacks at times would like rx to have.    Hypertension    Has concerns about his bp and questions about his medications.     HPI Andrew Fields is a 48 y.o. male with past medical history of HTN, GERD, panic disorder and obesity who presents for establishing care.  HTN: His BP is WNL today.  He was placed on lisinopril  20 mg QD and HCTZ 25 mg QD by previous PCP.  He has been following strict low-carb diet and has lost about 80 lbs in the last 5 months. His BP was improving with weight loss and had started having hypotensive spells with his medicines.  He stopped taking both his medicines for a while, which led to elevated blood pressure recently.  He has taken lisinopril  20 mg and half tablet of HCTZ today.  He denies any headache, dizziness, chest pain, dyspnea or palpitations.  Panic disorder: He has episodes of severe anxiety, which leads to chest tightness and palpitations.  He has been evaluated by cardiology for chest pain episodes, and was told of anxiety.  He responds well to Xanax  1 mg as needed, which he takes only as needed.  He has tried SSRI, but did not have any improvement in his symptoms.  Denies anhedonia, SI or HI currently.  GERD: He used to take pantoprazole  40 mg QD and Pepcid 40 mg QD for it.  He reports improvement in acid reflux since losing weight.  Denies dysphagia, odynophagia, nausea or vomiting.        Past Medical History:  Diagnosis Date   Hypertension     Past Surgical History:  Procedure Laterality Date   BIOPSY  04/08/2023   Procedure: BIOPSY;  Surgeon: Eartha Angelia Sieving, MD;  [facility];  Service: Gastroenterology;;   COLONOSCOPY WITH PROPOFOL  N/A 04/08/2023    Procedure: COLONOSCOPY WITH PROPOFOL ;  Surgeon: Eartha Angelia Sieving, MD;  [facility];  Service: Gastroenterology;  Laterality: N/A;  9:30 am   ESOPHAGOGASTRODUODENOSCOPY (EGD) WITH PROPOFOL  N/A 04/08/2023   Procedure: ESOPHAGOGASTRODUODENOSCOPY (EGD) WITH PROPOFOL ;  Surgeon: Eartha Angelia Sieving, MD;  [facility]: [facility];  Service: Gastroenterology;  Laterality: N/A;   KNEE ARTHROSCOPY W/ MENISCAL REPAIR Right 06/2019   POLYPECTOMY  04/08/2023   Procedure: POLYPECTOMY INTESTINAL;  Surgeon: Eartha Angelia Sieving, MD;  Location: [facility];  Service: Gastroenterology;;    Family History  Problem Relation Age of Onset   Hypertension Mother    Hypertension Father     Social History   Socioeconomic History   Marital status: Married    Spouse name: Not on file   Number of children: Not on file   Years of education: Not on file   Highest education level: Not on file  Occupational History   Not on file  Tobacco Use   Smoking status: Former   Smokeless tobacco: Current    Types: Chew  Vaping Use   Vaping status: Former   Substances: Nicotine-salt  Substance and Sexual Activity   Alcohol use: Not Currently    Comment: occ   Drug use: Never   Sexual activity: Not on file  Other Topics Concern  Not on file  Social History Narrative   Not on file   Social Drivers of Health   Financial Resource Strain: Not on file  Food Insecurity: Not on file  Transportation Needs: Not on file  Physical Activity: Not on file  Stress: Not on file  Social Connections: Not on file  Intimate Partner Violence: Not on file    ROS Review of Systems  Constitutional:  Negative for chills and fever.  HENT:  Negative for congestion and sore throat.   Eyes:  Negative for pain and discharge.  Respiratory:  Negative for cough and shortness of breath.   Cardiovascular:  Negative for chest pain and palpitations.  Gastrointestinal:  Negative for diarrhea, nausea and  vomiting.  Endocrine: Negative for polydipsia and polyuria.  Genitourinary:  Negative for dysuria and hematuria.  Musculoskeletal:  Negative for neck pain and neck stiffness.  Skin:  Negative for rash.  Neurological:  Negative for dizziness and weakness.  Psychiatric/Behavioral:  Negative for agitation and behavioral problems.     Objective:   Today's Vitals: BP 129/77   Pulse 84   Ht 6' 5 (1.956 m)   Wt 234 lb (106.1 kg)   SpO2 97%   BMI 27.75 kg/m   Physical Exam Vitals reviewed.  Constitutional:      General: He is not in acute distress.    Appearance: He is not diaphoretic.  HENT:     Head: Normocephalic and atraumatic.     Nose: Nose normal.     Mouth/Throat:     Mouth: Mucous membranes are moist.  Eyes:     General: No scleral icterus.    Extraocular Movements: Extraocular movements intact.  Cardiovascular:     Rate and Rhythm: Normal rate and regular rhythm.     Heart sounds: Normal heart sounds. No murmur heard. Pulmonary:     Breath sounds: Normal breath sounds. No wheezing or rales.  Musculoskeletal:     Cervical back: Neck supple. No tenderness.     Right lower leg: No edema.     Left lower leg: No edema.  Skin:    General: Skin is warm.     Findings: No rash.  Neurological:     General: No focal deficit present.     Mental Status: He is alert and oriented to person, place, and time.  Psychiatric:        Mood and Affect: Mood normal.        Behavior: Behavior normal.     Assessment & Plan:   Problem List Items Addressed This Visit       Cardiovascular and Mediastinum   Essential hypertension - Primary   BP Readings from Last 1 Encounters:  07/11/24 129/77   Well-controlled today DC hydrochlorothiazide  as he has had better BP at home with weight loss Continue Lisinopril  20 mg QD Counseled for compliance with the medications Advised DASH diet and moderate exercise/walking, at least 150 mins/week       Relevant Medications   lisinopril   (ZESTRIL ) 20 MG tablet   Other Relevant Orders   TSH   CMP14+EGFR   CBC with Differential/Platelet     Digestive   GERD (gastroesophageal reflux disease)   Well controlled now Avoid hot and spicy food DC pantoprazole  and Pepcid as he anyways does not take them now        Other   Panic disorder   Overall well-controlled with Xanax  1 mg as needed, does not take it daily - PDMP reviewed, refilled  Has not responded well to SSRI      Relevant Medications   ALPRAZolam  (XANAX ) 1 MG tablet   Other Relevant Orders   TSH   Mixed hyperlipidemia   Check lipid profile He takes fish oil, advised to continue to take it Advised to follow DASH diet      Relevant Medications   lisinopril  (ZESTRIL ) 20 MG tablet   Other Relevant Orders   Lipid panel   Overweight   BMI Readings from Last 3 Encounters:  07/11/24 27.75 kg/m  10/07/23 35.57 kg/m  04/08/23 36.52 kg/m   Has lost about 80 pounds in the last 5 months Advised to continue to follow low-carb diet and perform moderate exercise/walking at least 150 minutes/week      Other Visit Diagnoses       Need for hepatitis C screening test       Relevant Orders   Hepatitis C Antibody     Screening for HIV (human immunodeficiency virus)       Relevant Orders   HIV antibody (with reflex)     Vitamin D  deficiency       Relevant Orders   VITAMIN D  25 Hydroxy (Vit-D Deficiency, Fractures)     Hyperglycemia       Relevant Orders   Hemoglobin A1c       Outpatient Encounter Medications as of 07/11/2024  Medication Sig   Multiple Vitamins-Minerals (MULTIVITAMIN WITH MINERALS) tablet Take 1 tablet by mouth daily.   Omega-3 Fatty Acids (FISH OIL) 1000 MG CAPS Take 1,000 mg by mouth daily.   [DISCONTINUED] ALPRAZolam  (XANAX ) 1 MG tablet Take 1 tablet by mouth daily as needed for anxiety.   [DISCONTINUED] alum & mag hydroxide-simeth (MAALOX MAX) 400-400-40 MG/5ML suspension Take 10 mLs by mouth every 6 (six) hours as needed for  indigestion.   [DISCONTINUED] dicyclomine  (BENTYL ) 20 MG tablet Take 1 tablet (20 mg total) by mouth 2 (two) times daily as needed for spasms.   [DISCONTINUED] famotidine (PEPCID) 40 MG tablet Take 40 mg by mouth daily.   [DISCONTINUED] folic acid (FOLVITE) 1 MG tablet Take 1 mg by mouth daily.   [DISCONTINUED] hydrochlorothiazide  (HYDRODIURIL ) 25 MG tablet Take 25 mg by mouth daily.   [DISCONTINUED] lisinopril  (ZESTRIL ) 20 MG tablet Take 20 mg by mouth daily.   [DISCONTINUED] pantoprazole  (PROTONIX ) 40 MG tablet Take 1 tablet (40 mg total) by mouth daily before breakfast.   ALPRAZolam  (XANAX ) 1 MG tablet Take 1 tablet (1 mg total) by mouth daily as needed for anxiety.   lisinopril  (ZESTRIL ) 20 MG tablet Take 1 tablet (20 mg total) by mouth daily.   No facility-administered encounter medications on file as of 07/11/2024.    Follow-up: Return in about 4 months (around 11/11/2024) for HTN and GAD.   Suzzane MARLA Blanch, MD

## 2024-07-11 NOTE — Assessment & Plan Note (Signed)
 BP Readings from Last 1 Encounters:  07/11/24 129/77   Well-controlled today DC hydrochlorothiazide  as he has had better BP at home with weight loss Continue Lisinopril  20 mg QD Counseled for compliance with the medications Advised DASH diet and moderate exercise/walking, at least 150 mins/week

## 2024-07-11 NOTE — Patient Instructions (Signed)
 Please start taking Lisinopril  20 mg once daily.  Please stop taking hydrochlorothiazide .  Please continue to take medications as prescribed.  Please continue to follow low carb diet and perform moderate exercise/walking at least 150 mins/week.

## 2024-07-12 ENCOUNTER — Ambulatory Visit: Payer: Self-pay | Admitting: Internal Medicine

## 2024-07-12 LAB — CMP14+EGFR
ALT: 34 IU/L (ref 0–44)
AST: 36 IU/L (ref 0–40)
Albumin: 4.2 g/dL (ref 4.1–5.1)
Alkaline Phosphatase: 102 IU/L (ref 44–121)
BUN/Creatinine Ratio: 24 — ABNORMAL HIGH (ref 9–20)
BUN: 22 mg/dL (ref 6–24)
Bilirubin Total: 0.4 mg/dL (ref 0.0–1.2)
CO2: 25 mmol/L (ref 20–29)
Calcium: 9.7 mg/dL (ref 8.7–10.2)
Chloride: 104 mmol/L (ref 96–106)
Creatinine, Ser: 0.93 mg/dL (ref 0.76–1.27)
Globulin, Total: 2.8 g/dL (ref 1.5–4.5)
Glucose: 99 mg/dL (ref 70–99)
Potassium: 4 mmol/L (ref 3.5–5.2)
Sodium: 142 mmol/L (ref 134–144)
Total Protein: 7 g/dL (ref 6.0–8.5)
eGFR: 101 mL/min/1.73 (ref >59)

## 2024-07-12 LAB — CBC WITH DIFFERENTIAL/PLATELET
Basophils Absolute: 0 x10E3/uL (ref 0.0–0.2)
Basos: 0 %
EOS (ABSOLUTE): 0.1 x10E3/uL (ref 0.0–0.4)
Eos: 2 %
Hematocrit: 41.5 % (ref 37.5–51.0)
Hemoglobin: 13.6 g/dL (ref 13.0–17.7)
Immature Grans (Abs): 0 x10E3/uL (ref 0.0–0.1)
Immature Granulocytes: 0 %
Lymphocytes Absolute: 1.8 x10E3/uL (ref 0.7–3.1)
Lymphs: 22 %
MCH: 29.6 pg (ref 26.6–33.0)
MCHC: 32.8 g/dL (ref 31.5–35.7)
MCV: 90 fL (ref 79–97)
Monocytes Absolute: 0.5 x10E3/uL (ref 0.1–0.9)
Monocytes: 6 %
Neutrophils Absolute: 5.6 x10E3/uL (ref 1.4–7.0)
Neutrophils: 70 %
Platelets: 170 x10E3/uL (ref 150–450)
RBC: 4.59 x10E6/uL (ref 4.14–5.80)
RDW: 13.2 % (ref 11.6–15.4)
WBC: 8.1 x10E3/uL (ref 3.4–10.8)

## 2024-07-12 LAB — HEMOGLOBIN A1C
Est. average glucose Bld gHb Est-mCnc: 97 mg/dL
Hgb A1c MFr Bld: 5 % (ref 4.8–5.6)

## 2024-07-12 LAB — VITAMIN D 25 HYDROXY (VIT D DEFICIENCY, FRACTURES): Vit D, 25-Hydroxy: 44.5 ng/mL (ref 30.0–100.0)

## 2024-07-12 LAB — LIPID PANEL
Chol/HDL Ratio: 2.4 ratio (ref 0.0–5.0)
Cholesterol, Total: 169 mg/dL (ref 100–199)
HDL: 71 mg/dL (ref >39)
LDL Chol Calc (NIH): 83 mg/dL (ref 0–99)
Triglycerides: 79 mg/dL (ref 0–149)
VLDL Cholesterol Cal: 15 mg/dL (ref 5–40)

## 2024-07-12 LAB — HEPATITIS C ANTIBODY: Hep C Virus Ab: NONREACTIVE

## 2024-07-12 LAB — HIV ANTIBODY (ROUTINE TESTING W REFLEX): HIV Screen 4th Generation wRfx: NONREACTIVE

## 2024-07-12 LAB — TSH: TSH: 1.09 u[IU]/mL (ref 0.450–4.500)

## 2024-08-11 ENCOUNTER — Encounter: Payer: Self-pay | Admitting: Internal Medicine

## 2024-08-14 NOTE — Telephone Encounter (Signed)
 Called Andrew Fields, he will have her call our office and asked to speak to [name] needs to setup new patient with Dr Tobie, no phone number on file to reach this patient.

## 2024-10-11 ENCOUNTER — Encounter (INDEPENDENT_AMBULATORY_CARE_PROVIDER_SITE_OTHER): Payer: Self-pay | Admitting: Gastroenterology

## 2024-11-09 ENCOUNTER — Ambulatory Visit: Admitting: Internal Medicine

## 2024-11-09 ENCOUNTER — Encounter: Payer: Self-pay | Admitting: Internal Medicine

## 2024-11-09 VITALS — BP 121/79 | HR 75 | Ht 77.0 in | Wt 217.8 lb

## 2024-11-09 DIAGNOSIS — E782 Mixed hyperlipidemia: Secondary | ICD-10-CM | POA: Diagnosis not present

## 2024-11-09 DIAGNOSIS — Z0001 Encounter for general adult medical examination with abnormal findings: Secondary | ICD-10-CM | POA: Diagnosis not present

## 2024-11-09 DIAGNOSIS — E559 Vitamin D deficiency, unspecified: Secondary | ICD-10-CM

## 2024-11-09 DIAGNOSIS — I10 Essential (primary) hypertension: Secondary | ICD-10-CM | POA: Diagnosis not present

## 2024-11-09 DIAGNOSIS — F410 Panic disorder [episodic paroxysmal anxiety] without agoraphobia: Secondary | ICD-10-CM

## 2024-11-09 DIAGNOSIS — K219 Gastro-esophageal reflux disease without esophagitis: Secondary | ICD-10-CM

## 2024-11-09 DIAGNOSIS — M549 Dorsalgia, unspecified: Secondary | ICD-10-CM | POA: Insufficient documentation

## 2024-11-09 MED ORDER — LISINOPRIL 20 MG PO TABS: 20.0000 mg | ORAL_TABLET | Freq: Every day | ORAL | 1 refills | Status: AC

## 2024-11-09 MED ORDER — ALPRAZOLAM 1 MG PO TABS: 1.0000 mg | ORAL_TABLET | Freq: Every day | ORAL | 2 refills | Status: DC | PRN

## 2024-11-09 NOTE — Assessment & Plan Note (Signed)
 BP Readings from Last 1 Encounters:  11/09/24 121/79   Well-controlled with Lisinopril  20 mg QD now Counseled for compliance with the medications Advised DASH diet and moderate exercise/walking, at least 150 mins/week

## 2024-11-09 NOTE — Progress Notes (Signed)
 Established Patient Office Visit  Subjective:  Patient ID: Andrew Fields, male    DOB: Jul 21, 1976  Age: 48 y.o. MRN: [id number]  CC:  Chief Complaint  Patient presents with   Follow-up    Follow up, has a weird feeling in his throat. Report back pain at times.    Annual Exam    HPI [name] is a 48 y.o. male with past medical history of HTN, GERD, panic disorder and obesity who presents for f/u of his chronic medical conditions.  HTN: BP is well-controlled. Takes lisinopril  20 mg QD regularly. Patient denies headache, dizziness, chest pain, dyspnea or palpitations.  Panic disorder: He has episodes of severe anxiety, which leads to chest tightness and palpitations.  He has been evaluated by cardiology for chest pain episodes, and was told of anxiety.  He responds well to Xanax  1 mg as needed, which he takes only as needed.  He has tried SSRI, but did not have any improvement in his symptoms.  Denies anhedonia, SI or HI currently.  GERD: He reports weird feeling in his throat, that starts in the epigastric area and radiates towards his throat. He used to take pantoprazole  40 mg QD and Pepcid 40 mg QD for it, but improved later with weight loss. Denies dysphagia, odynophagia, nausea or vomiting.  He has lost about 17 lbs since the last visit, total of 100 lbs this year with diet changes and exercises.  He reports mid back pain, usually on the right side, which is intermittent and worse with walking on the treadmill.  Denies any recent injury or fall.  Past Medical History:  Diagnosis Date   Hypertension     Past Surgical History:  Procedure Laterality Date   BIOPSY  04/08/2023   Procedure: BIOPSY;  Surgeon: Eartha Angelia Sieving, MD;  [facility];  Service: Gastroenterology;;   COLONOSCOPY WITH PROPOFOL  N/A 04/08/2023   Procedure: COLONOSCOPY WITH PROPOFOL ;  Surgeon: Eartha Angelia Sieving, MD;  [facility];  Service: Gastroenterology;   Laterality: N/A;  9:30 am   ESOPHAGOGASTRODUODENOSCOPY (EGD) WITH PROPOFOL  N/A 04/08/2023   Procedure: ESOPHAGOGASTRODUODENOSCOPY (EGD) WITH PROPOFOL ;  Surgeon: Eartha Angelia Sieving, MD;  [facility];  Service: Gastroenterology;  Laterality: N/A;   KNEE ARTHROSCOPY W/ MENISCAL REPAIR Right 06/2019   POLYPECTOMY  04/08/2023   Procedure: POLYPECTOMY INTESTINAL;  Surgeon: Eartha Angelia, Sieving, MD;  [facility];  Service: Gastroenterology;;    Family History  Problem Relation Age of Onset   Hypertension Mother    Hypertension Father     Social History   Socioeconomic History   Marital status: Legally Separated    Spouse name: Not on file   Number of children: Not on file   Years of education: Not on file   Highest education level: Not on file  Occupational History   Not on file  Tobacco Use   Smoking status: Former   Smokeless tobacco: Current    Types: Chew  Vaping Use   Vaping status: Former   Substances: Nicotine-salt  Substance and Sexual Activity   Alcohol use: Not Currently    Comment: occ   Drug use: Never   Sexual activity: Not on file  Other Topics Concern   Not on file  Social History Narrative   Not on file   Social Drivers of Health   Financial Resource Strain: Not on file  Food Insecurity: Not on file  Transportation Needs: Not on file  Physical  Activity: Not on file  Stress: Not on file  Social Connections: Not on file  Intimate Partner Violence: Not on file    Outpatient Medications Prior to Visit  Medication Sig Dispense Refill   Multiple Vitamins-Minerals (MULTIVITAMIN WITH MINERALS) tablet Take 1 tablet by mouth daily.     Omega-3 Fatty Acids (FISH OIL) 1000 MG CAPS Take 1,000 mg by mouth daily.     ALPRAZolam  (XANAX ) 1 MG tablet Take 1 tablet (1 mg total) by mouth daily as needed for anxiety. 30 tablet 1   lisinopril  (ZESTRIL ) 20 MG tablet Take 1 tablet (20 mg total) by mouth daily. 30 tablet 5   No  facility-administered medications prior to visit.    No Known Allergies  ROS Review of Systems  Constitutional:  Negative for chills and fever.  HENT:  Negative for congestion and sore throat.   Eyes:  Negative for pain and discharge.  Respiratory:  Negative for cough and shortness of breath.   Cardiovascular:  Negative for chest pain and palpitations.  Gastrointestinal:  Negative for diarrhea, nausea and vomiting.  Endocrine: Negative for polydipsia and polyuria.  Genitourinary:  Negative for dysuria and hematuria.  Musculoskeletal:  Negative for neck pain and neck stiffness.  Skin:  Negative for rash.  Neurological:  Negative for dizziness and weakness.  Psychiatric/Behavioral:  Negative for agitation and behavioral problems.       Objective:    Physical Exam Vitals reviewed.  Constitutional:      General: He is not in acute distress.    Appearance: He is not diaphoretic.  HENT:     Head: Normocephalic and atraumatic.     Nose: Nose normal.     Mouth/Throat:     Mouth: Mucous membranes are moist.  Eyes:     General: No scleral icterus.    Extraocular Movements: Extraocular movements intact.  Cardiovascular:     Rate and Rhythm: Normal rate and regular rhythm.     Heart sounds: Normal heart sounds. No murmur heard. Pulmonary:     Breath sounds: Normal breath sounds. No wheezing or rales.  Abdominal:     Palpations: Abdomen is soft.     Tenderness: There is no abdominal tenderness.  Musculoskeletal:     Cervical back: Neck supple. No tenderness.     Thoracic back: Tenderness (Right paraspinal) present.     Lumbar back: No tenderness. Normal range of motion.     Right lower leg: No edema.     Left lower leg: No edema.  Skin:    General: Skin is warm.     Findings: No rash.  Neurological:     General: No focal deficit present.     Mental Status: He is alert and oriented to person, place, and time.     Cranial Nerves: No cranial nerve deficit.     Sensory: No  sensory deficit.     Motor: No weakness.  Psychiatric:        Mood and Affect: Mood normal.        Behavior: Behavior normal.     BP 121/79   Pulse 75   Ht 6' 5 (1.956 m)   Wt 217 lb 12.8 oz (98.8 kg)   SpO2 99%   BMI 25.83 kg/m  Wt Readings from Last 3 Encounters:  11/09/24 217 lb 12.8 oz (98.8 kg)  07/11/24 234 lb (106.1 kg)  10/07/23 300 lb (136.1 kg)    Lab Results  Component Value Date   TSH 1.090 07/11/2024  Lab Results  Component Value Date   WBC 8.1 07/11/2024   HGB 13.6 07/11/2024   HCT 41.5 07/11/2024   MCV 90 07/11/2024   PLT 170 07/11/2024   Lab Results  Component Value Date   NA 142 07/11/2024   K 4.0 07/11/2024   CO2 25 07/11/2024   GLUCOSE 99 07/11/2024   BUN 22 07/11/2024   CREATININE 0.93 07/11/2024   BILITOT 0.4 07/11/2024   ALKPHOS 102 07/11/2024   AST 36 07/11/2024   ALT 34 07/11/2024   PROT 7.0 07/11/2024   ALBUMIN 4.2 07/11/2024   CALCIUM 9.7 07/11/2024   ANIONGAP 10 10/07/2023   EGFR 101 07/11/2024   Lab Results  Component Value Date   CHOL 169 07/11/2024   Lab Results  Component Value Date   HDL 71 07/11/2024   Lab Results  Component Value Date   LDLCALC 83 07/11/2024   Lab Results  Component Value Date   TRIG 79 07/11/2024   Lab Results  Component Value Date   CHOLHDL 2.4 07/11/2024   Lab Results  Component Value Date   HGBA1C 5.0 07/11/2024      Assessment & Plan:   Problem List Items Addressed This Visit       Cardiovascular and Mediastinum   Essential hypertension   BP Readings from Last 1 Encounters:  11/09/24 121/79   Well-controlled with Lisinopril  20 mg QD now Counseled for compliance with the medications Advised DASH diet and moderate exercise/walking, at least 150 mins/week       Relevant Medications   lisinopril  (ZESTRIL ) 20 MG tablet   Other Relevant Orders   TSH   CMP14+EGFR   CBC with Differential/Platelet     Digestive   GERD (gastroesophageal reflux disease)   Throat and  epigastric discomfort likely due to GERD Advised to take Pepcid 20 mg QD Avoid hot and spicy food        Other   Panic disorder   Overall well-controlled with Xanax  1 mg as needed, does not take it daily - PDMP reviewed, refilled Has not responded well to SSRI      Relevant Medications   ALPRAZolam  (XANAX ) 1 MG tablet   Other Relevant Orders   TSH   Mixed hyperlipidemia   Checked lipid profile He takes fish oil, advised to continue to take it Advised to follow DASH diet      Relevant Medications   lisinopril  (ZESTRIL ) 20 MG tablet   Other Relevant Orders   Lipid panel   Mid back pain   Mid back pain, intermittent Likely muscular strain He reports his episode of low back pain with radicular symptoms in the past, had x-ray of lumbar spine about 3 years ago in [place], records unavailable Considering his location of pain is in the thoracic spine area, will get x-ray of thoracic spine Simple back exercises advised Tylenol or ibuprofen as needed for pain      Relevant Orders   DG Thoracic Spine W/Swimmers   Encounter for general adult medical examination with abnormal findings - Primary   Physical exam as documented. Fasting blood tests ordered today.      Other Visit Diagnoses       Vitamin D  deficiency       Relevant Orders   VITAMIN D  25 Hydroxy (Vit-D Deficiency, Fractures)       Meds ordered this encounter  Medications   lisinopril  (ZESTRIL ) 20 MG tablet    Sig: Take 1 tablet (20 mg total) by mouth daily.  Dispense:  90 tablet    Refill:  1   ALPRAZolam  (XANAX ) 1 MG tablet    Sig: Take 1 tablet (1 mg total) by mouth daily as needed for anxiety.    Dispense:  30 tablet    Refill:  2    Follow-up: Return in about 6 months (around 05/09/2025) for HTN.    Suzzane MARLA Blanch, MD

## 2024-11-09 NOTE — Assessment & Plan Note (Signed)
 Mid back pain, intermittent Likely muscular strain He reports his episode of low back pain with radicular symptoms in the past, had x-ray of lumbar spine about 3 years ago in [place], records unavailable Considering his location of pain is in the thoracic spine area, will get x-ray of thoracic spine Simple back exercises advised Tylenol or ibuprofen as needed for pain

## 2024-11-09 NOTE — Assessment & Plan Note (Signed)
 Overall well-controlled with Xanax  1 mg as needed, does not take it daily - PDMP reviewed, refilled Has not responded well to SSRI

## 2024-11-09 NOTE — Patient Instructions (Addendum)
 Please take Pepcid 20 mg once daily.  Please get x-ray of thoracic spine done at [facility].  Please continue to take other medications as prescribed.  Please continue to follow low carb diet and perform moderate exercise/walking at least 150 mins/week.  Please get fasting blood tests done before the next visit.

## 2024-11-09 NOTE — Assessment & Plan Note (Signed)
 Throat and epigastric discomfort likely due to GERD Advised to take Pepcid 20 mg QD Avoid hot and spicy food

## 2024-11-09 NOTE — Assessment & Plan Note (Signed)
Physical exam as documented. ?Fasting blood tests ordered today. ?

## 2024-11-09 NOTE — Assessment & Plan Note (Addendum)
 Checked lipid profile He takes fish oil, advised to continue to take it Advised to follow DASH diet

## 2024-11-27 ENCOUNTER — Other Ambulatory Visit: Payer: Self-pay | Admitting: Internal Medicine

## 2024-11-27 DIAGNOSIS — F410 Panic disorder [episodic paroxysmal anxiety] without agoraphobia: Secondary | ICD-10-CM

## 2024-11-27 NOTE — Telephone Encounter (Signed)
 Copied from CRM #[id number]. Topic: Clinical - Medication Refill >> Nov 27, 2024  9:42 AM Ashley R wrote: Medication:  lisinopril  (ZESTRIL ) 20 MG tablet ALPRAZolam  (XANAX ) 1 MG tablet     Has the patient contacted their pharmacy? Yes (Agent: If no, request that the patient contact the pharmacy for the refill. If patient does not wish to contact the pharmacy document the reason why and proceed with request.) (Agent: If yes, when and what did the pharmacy advise?)  This is the patient's preferred pharmacy:   [facility] [address] [place] - [address] [place] [address] [address] [place] [address] Phone: [phone] Fax: [phone]  Is this the correct pharmacy for this prescription? Yes If no, delete pharmacy and type the correct one.   Has the prescription been filled recently? Yes  Is the patient out of the medication? Yes  Has the patient been seen for an appointment in the last year OR does the patient have an upcoming appointment? Yes  Can we respond through MyChart? Yes  Agent: Please be advised that Rx refills may take up to 3 business days. We ask that you follow-up with your pharmacy.

## 2025-02-01 ENCOUNTER — Other Ambulatory Visit: Payer: Self-pay | Admitting: Internal Medicine

## 2025-02-01 DIAGNOSIS — F410 Panic disorder [episodic paroxysmal anxiety] without agoraphobia: Secondary | ICD-10-CM

## 2025-05-09 ENCOUNTER — Ambulatory Visit: Admitting: Internal Medicine
